# Patient Record
Sex: Female | Born: 1990 | Race: Black or African American | Hispanic: No | Marital: Single | Smoking: Never smoker
Health system: Southern US, Community
[De-identification: ages and names within clinical notes are randomized; demographics above are authoritative.]

## PROBLEM LIST (undated history)

## (undated) DIAGNOSIS — Z789 Other specified health status: Secondary | ICD-10-CM

## (undated) DIAGNOSIS — F419 Anxiety disorder, unspecified: Secondary | ICD-10-CM

## (undated) HISTORY — PX: NO PAST SURGERIES: SHX2092

---
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
## 2011-01-30 LAB — WET PREP, GENITAL
Trich, Wet Prep: NONE SEEN
WBC, Wet Prep HPF POC: NONE SEEN

## 2011-01-30 LAB — GC/CHLAMYDIA PROBE AMP, GENITAL
Chlamydia, DNA Probe: NEGATIVE
GC Probe Amp, Genital: NEGATIVE

## 2011-01-30 LAB — POCT PREGNANCY, URINE: Preg Test, Ur: NEGATIVE

## 2012-07-04 ENCOUNTER — Encounter (HOSPITAL_BASED_OUTPATIENT_CLINIC_OR_DEPARTMENT_OTHER): Payer: Self-pay | Admitting: *Deleted

## 2012-07-04 ENCOUNTER — Emergency Department (HOSPITAL_BASED_OUTPATIENT_CLINIC_OR_DEPARTMENT_OTHER)
Admission: EM | Admit: 2012-07-04 | Discharge: 2012-07-04 | Disposition: A | Discharge status: Home / Self Care | Payer: No Typology Code available for payment source | Attending: Emergency Medicine | Admitting: Emergency Medicine

## 2012-07-04 ENCOUNTER — Emergency Department (HOSPITAL_BASED_OUTPATIENT_CLINIC_OR_DEPARTMENT_OTHER): Payer: No Typology Code available for payment source

## 2012-07-04 DIAGNOSIS — IMO0002 Reserved for concepts with insufficient information to code with codable children: Secondary | ICD-10-CM | POA: Insufficient documentation

## 2012-07-04 DIAGNOSIS — S39012A Strain of muscle, fascia and tendon of lower back, initial encounter: Secondary | ICD-10-CM

## 2012-07-04 DIAGNOSIS — S40019A Contusion of unspecified shoulder, initial encounter: Secondary | ICD-10-CM

## 2012-07-04 DIAGNOSIS — S20219A Contusion of unspecified front wall of thorax, initial encounter: Secondary | ICD-10-CM | POA: Insufficient documentation

## 2012-07-04 MED ORDER — IBUPROFEN 600 MG PO TABS: 600.0000 mg | ORAL_TABLET | Freq: Four times a day (QID) | ORAL | Status: AC | PRN

## 2012-07-04 MED ORDER — OXYCODONE-ACETAMINOPHEN 5-325 MG PO TABS
2.0000 | ORAL_TABLET | Freq: Once | ORAL | Status: DC
  Filled 2012-07-04 (×3): qty 2

## 2012-07-04 MED ORDER — HYDROCODONE-ACETAMINOPHEN 5-500 MG PO TABS: 1.0000 | ORAL_TABLET | Freq: Four times a day (QID) | ORAL | Status: AC | PRN
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
## 2014-10-25 ENCOUNTER — Encounter (HOSPITAL_COMMUNITY): Payer: Self-pay | Admitting: Emergency Medicine

## 2014-10-25 ENCOUNTER — Emergency Department (HOSPITAL_COMMUNITY)
Admission: EM | Admit: 2014-10-25 | Discharge: 2014-10-26 | Disposition: A | Discharge status: Home / Self Care | Payer: No Typology Code available for payment source | Attending: Emergency Medicine | Admitting: Emergency Medicine

## 2014-10-25 DIAGNOSIS — B373 Candidiasis of vulva and vagina: Secondary | ICD-10-CM | POA: Insufficient documentation

## 2014-10-25 DIAGNOSIS — Y9289 Other specified places as the place of occurrence of the external cause: Secondary | ICD-10-CM | POA: Insufficient documentation

## 2014-10-25 DIAGNOSIS — S30814A Abrasion of vagina and vulva, initial encounter: Secondary | ICD-10-CM

## 2014-10-25 DIAGNOSIS — Y288XXA Contact with other sharp object, undetermined intent, initial encounter: Secondary | ICD-10-CM | POA: Insufficient documentation

## 2014-10-25 DIAGNOSIS — B9689 Other specified bacterial agents as the cause of diseases classified elsewhere: Secondary | ICD-10-CM

## 2014-10-25 DIAGNOSIS — N760 Acute vaginitis: Secondary | ICD-10-CM | POA: Insufficient documentation

## 2014-10-25 DIAGNOSIS — B3731 Acute candidiasis of vulva and vagina: Secondary | ICD-10-CM

## 2014-10-25 DIAGNOSIS — Y9389 Activity, other specified: Secondary | ICD-10-CM | POA: Insufficient documentation

## 2014-10-25 DIAGNOSIS — Y998 Other external cause status: Secondary | ICD-10-CM | POA: Insufficient documentation

## 2014-10-25 DIAGNOSIS — N762 Acute vulvitis: Secondary | ICD-10-CM

## 2014-10-25 NOTE — ED Notes (Signed)
Pt. reports vaginal skin irritation after shaving 3 days ago reports pain at vagina  , denies fever or chills. No discharge .

## 2014-10-25 NOTE — ED Provider Notes (Signed)
CSN: [id number]     Arrival date & time 10/25/14  2233 History   First MD Initiated Contact with Patient 10/25/14 2309     Chief Complaint  Patient presents with  . Vaginal Pain     (Consider location/radiation/quality/duration/timing/severity/associated sxs/prior Treatment) HPI Comments: Whitney MonarchMegan Lang is a 23 y.o. Healthy female, who presents to the ED with complaints of vaginal irritation after shaving 3 days ago. She reports that she caused 3 cuts on her labia near the introitus after using a dull razor. She describes the pain as stinging/burning, intermittent, 6/10 in severity, located in the areas that are cut/abraded, nonradiating, worse with moisture or urine exposure to the skin, and unrelieved with vagisil. She has not tried anything else for her symptoms. She reports the left labia has become slightly swollen and mildly erythematous, with no induration, warmth, or red streaking. She states that there is a clear drainage from the cuts but no purulent drainage. Denies fevers, chills, CP, SOB, abdominal pain, nausea, vomiting, diarrhea, constipation, dysuria, hematuria, hematochezia, melena, vaginal discharge, vaginal bleeding, myalgias, arthralgias, rectal pain/bleeding, numbness, weakness, paresthesias, or lymphadenopathy. Sexually active with 1 female partner, no vaginal penetration.   Patient is a 23 y.o. female presenting with general illness. The history is provided by the patient. No language interpreter was used.  Illness Location:  Labia Quality:  Burning/stinging Severity:  Mild Onset quality:  Gradual Duration:  3 days Timing:  Intermittent Progression:  Unchanged Chronicity:  New Context:  Cut labia while shaving Relieved by:  Nothing Worsened by:  Moisture, urine touching cuts Ineffective treatments:  Vagisil Associated symptoms: no abdominal pain, no chest pain, no diarrhea, no fever, no myalgias, no nausea, no rash, no shortness of breath and no vomiting     History  reviewed. No pertinent past medical history. History reviewed. No pertinent past surgical history. No family history on file. History  Substance Use Topics  . Smoking status: Never Smoker   . Smokeless tobacco: Not on file  . Alcohol Use: No   OB History    No data available     Review of Systems  Constitutional: Negative for fever and chills.  Respiratory: Negative for shortness of breath.   Cardiovascular: Negative for chest pain.  Gastrointestinal: Negative for nausea, vomiting, abdominal pain, diarrhea, constipation, blood in stool and rectal pain.  Genitourinary: Positive for genital sores (cuts from shaving) and vaginal pain (labial). Negative for dysuria, urgency, frequency, hematuria, flank pain, vaginal bleeding, vaginal discharge, difficulty urinating and pelvic pain.  Musculoskeletal: Negative for myalgias, back pain and arthralgias.  Skin: Negative for rash.  Neurological: Negative for weakness and numbness.  Hematological: Negative for adenopathy.   10 Systems reviewed and are negative for acute change except as noted in the HPI.    Allergies  Review of patient's allergies indicates no known allergies.  Home Medications   Prior to Admission medications   Not on File   BP 113/74 mmHg  Pulse 99  Temp(Src) 98.6 F (37 C) (Oral)  Resp 20  Ht 5\' 1"  (1.549 m)  Wt 130 lb (58.968 kg)  BMI 24.58 kg/m2  SpO2 100%  LMP 10/08/2014 Physical Exam  Constitutional: She is oriented to person, place, and time. Vital signs are normal. She appears well-developed and well-nourished.  Non-toxic appearance. No distress.  Afebrile, nontoxic, NAD  HENT:  Head: Normocephalic and atraumatic.  Mouth/Throat: Oropharynx is clear and moist and mucous membranes are normal.  Eyes: Conjunctivae and EOM are normal. Right eye exhibits no  discharge. Left eye exhibits no discharge.  Neck: Normal range of motion. Neck supple.  Cardiovascular: Normal rate, regular rhythm, normal heart  sounds and intact distal pulses.  Exam reveals no gallop and no friction rub.   No murmur heard. Pulmonary/Chest: Effort normal and breath sounds normal. No respiratory distress. She has no decreased breath sounds. She has no wheezes. She has no rhonchi. She has no rales.  Abdominal: Soft. Normal appearance and bowel sounds are normal. She exhibits no distension. There is no tenderness. There is no rigidity, no rebound, no guarding, no CVA tenderness, no tenderness at McBurney's point and negative Murphy's sign.  Soft, NTND, +BS throughout, no r/g/r, neg murphy's, neg mcburney's, no CVA TTP  Genitourinary:    Pelvic exam was performed with patient supine. There is tenderness and injury on the right labia. There is no rash or lesion on the right labia. There is tenderness and injury on the left labia. There is no rash or lesion on the left labia. No bleeding in the vagina. Vaginal discharge found.  3 small abrasions located on b/l labial near introitus, exquisitely TTP, no vesicles or ulcerations, with L labia erythematous and slightly swollen without warmth, induration, red streaking, or abscess. No drainage from wounds but small amount of whiteish thin vaginal discharge at introitus. No bartholin's cyst or abscess palpated. No LAD. No vaginal bleeding. Did not perform full pelvic exam.  Musculoskeletal: Normal range of motion.  Lymphadenopathy:       Right: No inguinal adenopathy present.       Left: No inguinal adenopathy present.  Neurological: She is alert and oriented to person, place, and time. She has normal strength. No sensory deficit.  Skin: Skin is warm, dry and intact. No rash noted.  Psychiatric: She has a normal mood and affect.  Nursing note and vitals reviewed.   ED Course  Procedures (including critical care time) Labs Review Labs Reviewed  WET PREP, GENITAL - Abnormal; Notable for the following:    Yeast Wet Prep HPF POC FEW (*)    Clue Cells Wet Prep HPF POC MANY (*)     WBC, Wet Prep HPF POC MODERATE (*)    All other components within normal limits    Imaging Review No results found.   EKG Interpretation None      MDM   Final diagnoses:  Yeast vaginitis  BV (bacterial vaginosis)  Labial abrasion, initial encounter  Cellulitis of labia    23 y.o. female here with vaginal abrasions after shaving which appear to be progressing into cellulitis. Visual exam reveals small abrasions, no vesicles or ulcerations, slight labial swelling which appears to be early cellulitis, no abscess or induration. Doubt syphilis or herpes, no bartholin's gland cyst/abscess, doubt lymphogranuloma venereum. Blind swab of vaginal cavity performed to eval for BV/yeast given small amount of vaginal discharge at introitus. No abd pain or dysuria/hematuria. Pt not sexually active with males, no complaints of vaginal discharge, Doubt need for further labs or testing or a full pelvic exam. Will reassess shortly.   12:27 AM Wet prep showing yeast and BV. Will treat for these. Will also give doxy for what appears to be a cellulitis related to shaving. Discussed proper pericare.  Will have her f/up with OBGYN for ongoing care. I explained the diagnosis and have given explicit precautions to return to the ER including for any other new or worsening symptoms. The patient understands and accepts the medical plan as it's been dictated and I have answered  their questions. Discharge instructions concerning home care and prescriptions have been given. The patient is STABLE and is discharged to home in good condition.  BP 107/71 mmHg  Pulse 81  Temp(Src) 98.6 F (37 C) (Oral)  Resp 22  Ht 5\' 1"  (1.549 m)  Wt 130 lb (58.968 kg)  BMI 24.58 kg/m2  SpO2 100%  LMP 10/08/2014  Meds ordered this encounter  Medications  . metroNIDAZOLE (FLAGYL) 500 MG tablet    Sig: Take 1 tablet (500 mg total) by mouth 2 (two) times daily. One po bid x 7 days    Dispense:  14 tablet    Refill:  0    Order  Specific Question:  Supervising Provider    Answer:  Eber HongMILLER, BRIAN D [[id number]]  . fluconazole (DIFLUCAN) 150 MG tablet    Sig: Take 1 tablet (150 mg total) by mouth once.    Dispense:  1 tablet    Refill:  0    Order Specific Question:  Supervising Provider    Answer:  Eber HongMILLER, BRIAN D [[id number]]  . doxycycline (VIBRAMYCIN) 100 MG capsule    Sig: Take 1 capsule (100 mg total) by mouth 2 (two) times daily. One po bid x 7 days    Dispense:  14 capsule    Refill:  0    Order Specific Question:  Supervising Provider    Answer:  Vida RollerMILLER, BRIAN D [id number][[id number]]     Pam Vanalstine Strupp Camprubi-Soms, PA-C 10/26/14 0111  Geoffery Lyonsouglas Delo, MD 10/26/14 [phone]

## 2014-10-25 NOTE — ED Notes (Signed)
Pt monitored by pulse ox, bp cuff, and 5-lead. 

## 2014-10-26 LAB — WET PREP, GENITAL: Trich, Wet Prep: NONE SEEN

## 2014-10-26 MED ORDER — DOXYCYCLINE HYCLATE 100 MG PO CAPS: 100.0000 mg | ORAL_CAPSULE | Freq: Two times a day (BID) | ORAL | Status: DC

## 2014-10-26 MED ORDER — METRONIDAZOLE 500 MG PO TABS: 500.0000 mg | ORAL_TABLET | Freq: Two times a day (BID) | ORAL | Status: DC

## 2014-10-26 MED ORDER — FLUCONAZOLE 150 MG PO TABS: 150.0000 mg | ORAL_TABLET | Freq: Once | ORAL | Status: DC

## 2014-10-26 NOTE — Discharge Instructions (Signed)
Stay very well hydrated with plenty of water throughout the day. Take antibiotics until completed, avoid alcohol and direct sunlight. Use tylenol or motrin for pain. Keep your skin clean and dry, perform sitz baths 4 times per day, and change your underwear whenever it is wet. Keep your vagina dry using a cold hairdryer. Avoid shaving. See the women's clinic in 1 week for recheck of ongoing symptoms but return to ER for emergent changing or worsening of symptoms.   Bacterial Vaginosis Bacterial vaginosis is an infection of the vagina. It happens when too many of certain germs (bacteria) grow in the vagina. HOME CARE  Take your medicine as told by your doctor.  Finish your medicine even if you start to feel better.  Do not have sex until you finish your medicine and are better.  Tell your sex partner that you have an infection. They should see their doctor for treatment.  Practice safe sex. Use condoms. Have only one sex partner. GET HELP IF:  You are not getting better after 3 days of treatment.  You have more grey fluid (discharge) coming from your vagina than before.  You have more pain than before.  You have a fever. MAKE SURE YOU:   Understand these instructions.  Will watch your condition.  Will get help right away if you are not doing well or get worse. Document Released: 08/08/2008 Document Revised: 08/20/2013 Document Reviewed: 06/11/2013 [facility] Patient Information 2015 [place], [place]. This information is not intended to replace advice given to you by your health care provider. Make sure you discuss any questions you have with your health care provider.  Candidal Vulvovaginitis Candidal vulvovaginitis is an infection of the vagina and vulva. The vulva is the skin around the opening of the vagina. This may cause itching and discomfort in and around the vagina.  HOME CARE  Only take medicine as told by your doctor.  Do not have sex (intercourse) until the infection  is healed or as told by your doctor.  Practice safe sex.  Tell your sex partner about your infection.  Do not douche or use tampons.  Wear cotton underwear. Do not wear tight pants or panty hose.  Eat yogurt. This may help treat and prevent yeast infections. GET HELP RIGHT AWAY IF:   You have a fever.  Your problems get worse during treatment or do not get better in 3 days.  You have discomfort, irritation, or itching in your vagina or vulva area.  You have pain after sex.  You start to get belly (abdominal) pain. MAKE SURE YOU:  Understand these instructions.  Will watch your condition.  Will get help right away if you are not doing well or get worse. Document Released: 01/26/2009 Document Revised: 11/04/2013 Document Reviewed: 01/26/2009 [facility]Exit[facility] Patient Information 2015 [place]Care, [place]. This information is not intended to replace advice given to you by your health care provider. Make sure you discuss any questions you have with your health care provider.   Cellulitis Cellulitis is an infection of the skin and the tissue under the skin. The infected area is usually red and tender. This happens most often in the arms and lower legs. HOME CARE   Take your antibiotic medicine as told. Finish the medicine even if you start to feel better.  Keep the infected arm or leg raised (elevated).  Put a warm cloth on the area up to 4 times per day.  Only take medicines as told by your doctor.  Keep all doctor  visits as told. GET HELP IF:  You see red streaks on the skin coming from the infected area.  Your red area gets bigger or turns a dark color.  Your bone or joint under the infected area is painful after the skin heals.  Your infection comes back in the same area or different area.  You have a puffy (swollen) bump in the infected area.  You have new symptoms.  You have a fever. GET HELP RIGHT AWAY IF:   You feel very sleepy.  You throw up (vomit) or have  watery poop (diarrhea).  You feel sick and have muscle aches and pains. MAKE SURE YOU:   Understand these instructions.  Will watch your condition.  Will get help right away if you are not doing well or get worse. Document Released: 04/17/2008 Document Revised: 03/16/2014 Document Reviewed: 01/15/2012 [facility] Patient Information 2015 [place], [place]LL[place]. This information is not intended to replace advice given to you by your health care provider. Make sure you discuss any questions you have with your health care provider.  Abrasions An abrasion is a cut or scrape of the skin. Abrasions do not go through all layers of the skin. HOME CARE  If a bandage (dressing) was put on your wound, change it as told by your doctor. If the bandage sticks, soak it off with warm.  Wash the area with water and soap 2 times a day. Rinse off the soap. Pat the area dry with a clean towel.  Put on medicated cream (ointment) as told by your doctor.  Change your bandage right away if it gets wet or dirty.  Only take medicine as told by your doctor.  See your doctor within 24-48 hours to get your wound checked.  Check your wound for redness, puffiness (swelling), or yellowish-white fluid (pus). GET HELP RIGHT AWAY IF:   You have more pain in the wound.  You have redness, swelling, or tenderness around the wound.  You have pus coming from the wound.  You have a fever or lasting symptoms for more than 2-3 days.  You have a fever and your symptoms suddenly get worse.  You have a bad smell coming from the wound or bandage. MAKE SURE YOU:   Understand these instructions.  Will watch your condition.  Will get help right away if you are not doing well or get worse. Document Released: 04/17/2008 Document Revised: 07/24/2012 Document Reviewed: 10/03/2011 [facility] StocktonExitCare Patient Information 2015 [place]ExitCare, [place]LL[place]. This information is not intended to replace advice given to you by your health care provider. Make  sure you discuss any questions you have with your health care provider.

## 2014-12-15 ENCOUNTER — Emergency Department (HOSPITAL_COMMUNITY)
Admission: EM | Admit: 2014-12-15 | Discharge: 2014-12-15 | Disposition: A | Discharge status: Home / Self Care | Payer: Self-pay | Attending: Emergency Medicine | Admitting: Emergency Medicine

## 2014-12-15 ENCOUNTER — Encounter (HOSPITAL_COMMUNITY): Payer: Self-pay | Admitting: Emergency Medicine

## 2014-12-15 ENCOUNTER — Emergency Department (HOSPITAL_COMMUNITY): Payer: Self-pay

## 2014-12-15 DIAGNOSIS — S6991XA Unspecified injury of right wrist, hand and finger(s), initial encounter: Secondary | ICD-10-CM | POA: Insufficient documentation

## 2014-12-15 DIAGNOSIS — Y92410 Unspecified street and highway as the place of occurrence of the external cause: Secondary | ICD-10-CM | POA: Insufficient documentation

## 2014-12-15 DIAGNOSIS — Y998 Other external cause status: Secondary | ICD-10-CM | POA: Insufficient documentation

## 2014-12-15 DIAGNOSIS — Z792 Long term (current) use of antibiotics: Secondary | ICD-10-CM | POA: Insufficient documentation

## 2014-12-15 DIAGNOSIS — Y9389 Activity, other specified: Secondary | ICD-10-CM | POA: Insufficient documentation

## 2014-12-15 DIAGNOSIS — Z79899 Other long term (current) drug therapy: Secondary | ICD-10-CM | POA: Insufficient documentation

## 2014-12-15 DIAGNOSIS — M79644 Pain in right finger(s): Secondary | ICD-10-CM

## 2014-12-15 MED ORDER — HYDROCODONE-ACETAMINOPHEN 5-325 MG PO TABS: 1.0000 | ORAL_TABLET | Freq: Four times a day (QID) | ORAL | Status: DC | PRN
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
## 2014-12-15 NOTE — Progress Notes (Signed)
Orthopedic Tech Progress Note Patient Details:  Whitney Lang 02/10/1991 [id number]  Ortho Devices Type of Ortho Device: Thumb velcro splint Ortho Device/Splint Location: RUE Ortho Device/Splint Interventions: Ordered, Application   Jennye MoccasinHughes, Petrina Melby Craig 12/15/2014, 6:32 PM
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
## 2014-12-15 NOTE — Discharge Instructions (Signed)
Scaphoid Fracture, Wrist A fracture is a break in the bone. The bone you have broken often does not show up as a fracture on x-ray until later on in the healing phase. This bone is called the scaphoid bone. With this bone, your caregiver will often cast or splint your wrist as though it is fractured, even if a fracture is not seen on the x-ray. This is often done with wrist injuries in which there is tenderness at the base of the thumb. An x-ray at 1-3 weeks after your injury may confirm this fracture. A cast or splint is used to protect and keep your injured bone in good position for healing. The cast or splint will be on generally for about 6 to 16 weeks, depending on your health, age, the fracture location and how quickly you heal. Another name for the scaphoid bone is the navicular bone. HOME CARE INSTRUCTIONS   To lessen the swelling and pain, keep the injured part elevated above your heart while sitting or lying down.  Apply ice to the injury for 15-20 minutes, 03-04 times per day while awake, for 2 days. Put the ice in a plastic bag and place a thin towel between the bag of ice and your cast.  If you have a plaster or fiberglass cast or splint:  Do not try to scratch the skin under the cast using sharp or pointed objects.  Check the skin around the cast every day. You may put lotion on any red or sore areas.  Keep your cast or splint dry and clean.  If you have a plaster splint:  Wear the splint as directed.  You may loosen the elastic bandage around the splint if your fingers become numb, tingle, or turn cold or blue.  If you have been put in a removable splint, wear and use as directed.  Do not put pressure on any part of your cast or splint; it may deform or break. Rest your cast or splint only on a pillow the first 24 hours until it is fully hardened.  Your cast or splint can be protected during bathing with a plastic bag. Do not lower the cast or splint into water.  Only take  over-the-counter or prescription medicines for pain, discomfort, or fever as directed by your caregiver.  If your caregiver has given you a follow up appointment, it is very important to keep that appointment. Not keeping the appointment could result in chronic pain and decreased function. If there is any problem keeping the appointment, you must call back to this facility for assistance. SEEK IMMEDIATE MEDICAL CARE IF:   Your cast gets damaged, wet or breaks.  You have continued severe pain or more swelling than you did before the cast or splint was put on.  Your skin or nails below the injury turn blue or gray, or feel cold or numb.  You have tingling or burning pain in your fingers or increasing pain with movement of your fingers Document Released: 10/20/2002 Document Revised: 01/22/2012 Document Reviewed: 06/18/2009 ExitCare Patient Information 2015 [employer]. This information is not intended to replace advice given to you by your health care provider. Make sure you discuss any questions you have with your health care provider.  

## 2015-01-12 ENCOUNTER — Emergency Department (HOSPITAL_COMMUNITY)
Admission: EM | Admit: 2015-01-12 | Discharge: 2015-01-12 | Discharge status: Against Medical Advice | Payer: Self-pay | Attending: Emergency Medicine | Admitting: Emergency Medicine

## 2015-01-12 ENCOUNTER — Encounter (HOSPITAL_COMMUNITY): Payer: Self-pay | Admitting: Emergency Medicine

## 2015-01-12 DIAGNOSIS — Z202 Contact with and (suspected) exposure to infections with a predominantly sexual mode of transmission: Secondary | ICD-10-CM | POA: Insufficient documentation
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
## 2015-02-05 ENCOUNTER — Encounter (HOSPITAL_COMMUNITY): Payer: Self-pay | Admitting: Emergency Medicine

## 2015-02-05 ENCOUNTER — Emergency Department (HOSPITAL_COMMUNITY)
Admission: EM | Admit: 2015-02-05 | Discharge: 2015-02-05 | Disposition: A | Discharge status: Home / Self Care | Payer: Self-pay | Attending: Emergency Medicine | Admitting: Emergency Medicine

## 2015-02-05 DIAGNOSIS — R197 Diarrhea, unspecified: Secondary | ICD-10-CM | POA: Insufficient documentation

## 2015-02-05 DIAGNOSIS — Z3202 Encounter for pregnancy test, result negative: Secondary | ICD-10-CM | POA: Insufficient documentation

## 2015-02-05 DIAGNOSIS — R1110 Vomiting, unspecified: Secondary | ICD-10-CM

## 2015-02-05 DIAGNOSIS — R112 Nausea with vomiting, unspecified: Secondary | ICD-10-CM | POA: Insufficient documentation

## 2015-02-05 DIAGNOSIS — Z792 Long term (current) use of antibiotics: Secondary | ICD-10-CM | POA: Insufficient documentation

## 2015-02-05 LAB — URINALYSIS, ROUTINE W REFLEX MICROSCOPIC
Bilirubin Urine: NEGATIVE
Glucose, UA: NEGATIVE mg/dL
Hgb urine dipstick: NEGATIVE
Ketones, ur: NEGATIVE mg/dL
Leukocytes, UA: NEGATIVE
Nitrite: NEGATIVE
Protein, ur: NEGATIVE mg/dL
Specific Gravity, Urine: 1.022 (ref 1.005–1.030)
Urobilinogen, UA: 0.2 mg/dL (ref 0.0–1.0)
pH: 8 (ref 5.0–8.0)

## 2015-02-05 LAB — COMPREHENSIVE METABOLIC PANEL
ALT: 16 U/L (ref 0–35)
AST: 24 U/L (ref 0–37)
Albumin: 4.6 g/dL (ref 3.5–5.2)
Alkaline Phosphatase: 73 U/L (ref 39–117)
Anion gap: 11 (ref 5–15)
BUN: 12 mg/dL (ref 6–23)
CO2: 25 mmol/L (ref 19–32)
Calcium: 9.6 mg/dL (ref 8.4–10.5)
Chloride: 104 mmol/L (ref 96–112)
Creatinine, Ser: 0.72 mg/dL (ref 0.50–1.10)
GFR calc Af Amer: >90 mL/min (ref >90)
GFR calc non Af Amer: >90 mL/min (ref >90)
Glucose, Bld: 95 mg/dL (ref 70–99)
Potassium: 4.1 mmol/L (ref 3.5–5.1)
Sodium: 140 mmol/L (ref 135–145)
Total Bilirubin: 0.6 mg/dL (ref 0.3–1.2)
Total Protein: 8.8 g/dL — ABNORMAL HIGH (ref 6.0–8.3)

## 2015-02-05 LAB — CBC WITH DIFFERENTIAL/PLATELET
Basophils Absolute: 0 10*3/uL (ref 0.0–0.1)
Basophils Relative: 0 % (ref 0–1)
Eosinophils Absolute: 0 10*3/uL (ref 0.0–0.7)
Eosinophils Relative: 0 % (ref 0–5)
HCT: 40 % (ref 36.0–46.0)
Hemoglobin: 12.3 g/dL (ref 12.0–15.0)
Lymphocytes Relative: 8 % — ABNORMAL LOW (ref 12–46)
Lymphs Abs: 0.7 10*3/uL (ref 0.7–4.0)
MCH: 25.9 pg — ABNORMAL LOW (ref 26.0–34.0)
MCHC: 30.8 g/dL (ref 30.0–36.0)
MCV: 84.2 fL (ref 78.0–100.0)
Monocytes Absolute: 0.5 10*3/uL (ref 0.1–1.0)
Monocytes Relative: 5 % (ref 3–12)
Neutro Abs: 7.7 10*3/uL (ref 1.7–7.7)
Neutrophils Relative %: 87 % — ABNORMAL HIGH (ref 43–77)
Platelets: 440 10*3/uL — ABNORMAL HIGH (ref 150–400)
RBC: 4.75 MIL/uL (ref 3.87–5.11)
RDW: 13.8 % (ref 11.5–15.5)
WBC: 8.9 10*3/uL (ref 4.0–10.5)

## 2015-02-05 LAB — LIPASE, BLOOD: Lipase: 16 U/L (ref 11–59)

## 2015-02-05 LAB — PREGNANCY, URINE: Preg Test, Ur: NEGATIVE

## 2015-02-05 MED ORDER — ONDANSETRON HCL 4 MG/2ML IJ SOLN
4.0000 mg | Freq: Once | INTRAMUSCULAR | Status: AC
  Administered 2015-02-05: 4 mg via INTRAVENOUS
  Filled 2015-02-05: qty 2

## 2015-02-05 MED ORDER — KETOROLAC TROMETHAMINE 30 MG/ML IJ SOLN
30.0000 mg | Freq: Once | INTRAMUSCULAR | Status: AC
Start: 2015-02-05 — End: 2015-02-05
  Administered 2015-02-05: 30 mg via INTRAVENOUS
  Filled 2015-02-05: qty 1

## 2015-02-05 MED ORDER — IBUPROFEN 200 MG PO TABS
600.0000 mg | ORAL_TABLET | Freq: Once | ORAL | Status: AC
  Administered 2015-02-05: 600 mg via ORAL
  Filled 2015-02-05: qty 3

## 2015-02-05 MED ORDER — ONDANSETRON 4 MG PO TBDP
4.0000 mg | ORAL_TABLET | Freq: Three times a day (TID) | ORAL | Status: DC | PRN
Start: 2015-02-05 — End: 2015-10-13

## 2015-02-05 MED ORDER — SODIUM CHLORIDE 0.9 % IV BOLUS (SEPSIS)
1000.0000 mL | Freq: Once | INTRAVENOUS | Status: AC
  Administered 2015-02-05: 1000 mL via INTRAVENOUS

## 2015-02-05 NOTE — ED Provider Notes (Signed)
CSN: [id number]     Arrival date & time 02/05/15  1554 History   First MD Initiated Contact with Patient 02/05/15 1803     Chief Complaint  Patient presents with  . Emesis  . Generalized Body Aches     (Consider location/radiation/quality/duration/timing/severity/associated sxs/prior Treatment) Patient is a 24 y.o. female presenting with vomiting. The history is provided by the patient. No language interpreter was used.  Emesis Severity:  Moderate Duration:  1 day Timing:  Constant Number of daily episodes:  Multiple Progression:  Worsening Chronicity:  New Recent urination:  Normal Relieved by:  Nothing Ineffective treatments:  None tried Associated symptoms: diarrhea   Associated symptoms: no abdominal pain   Risk factors: sick contacts   Risk factors: no diabetes     History reviewed. No pertinent past medical history. History reviewed. No pertinent past surgical history. History reviewed. No pertinent family history. History  Substance Use Topics  . Smoking status: Never Smoker   . Smokeless tobacco: Never Used  . Alcohol Use: No   OB History    No data available     Review of Systems  Gastrointestinal: Positive for nausea, vomiting and diarrhea. Negative for abdominal pain.  All other systems reviewed and are negative.     Allergies  Review of patient's allergies indicates no known allergies.  Home Medications   Prior to Admission medications   Medication Sig Start Date End Date Taking? Authorizing Provider  doxycycline (VIBRAMYCIN) 100 MG capsule Take 1 capsule (100 mg total) by mouth 2 (two) times daily. One po bid x 7 days Patient not taking: Reported on 01/13/2015 10/26/14   Mercedes Camprubi-Soms, PA-C  fluconazole (DIFLUCAN) 150 MG tablet Take 1 tablet (150 mg total) by mouth once. Patient not taking: Reported on 01/13/2015 10/26/14   Mercedes Camprubi-Soms, PA-C  HYDROcodone-acetaminophen (NORCO/VICODIN) 5-325 MG per tablet Take 1-2 tablets by mouth  every 6 (six) hours as needed. Patient not taking: Reported on 01/13/2015 12/15/14   Roxy Horseman, PA-C  metroNIDAZOLE (FLAGYL) 500 MG tablet Take 1 tablet (500 mg total) by mouth 2 (two) times daily. One po bid x 7 days Patient not taking: Reported on 01/13/2015 10/26/14   Mercedes Camprubi-Soms, PA-C   BP 103/67 mmHg  Pulse 109  Temp(Src) 100.7 F (38.2 C) (Oral)  Resp 18  SpO2 100%  LMP 01/28/2015 Physical Exam  Constitutional: She is oriented to person, place, and time. She appears well-developed and well-nourished.  HENT:  Head: Normocephalic.  Eyes: Conjunctivae and EOM are normal. Pupils are equal, round, and reactive to light.  Neck: Normal range of motion.  Cardiovascular: Normal rate and normal heart sounds.   Pulmonary/Chest: Effort normal.  Abdominal: Soft. She exhibits no distension.  Musculoskeletal: Normal range of motion.  Neurological: She is alert and oriented to person, place, and time. She has normal reflexes.  Skin: Skin is warm.  Psychiatric: She has a normal mood and affect.  Nursing note and vitals reviewed.   ED Course  Procedures (including critical care time) Labs Review Labs Reviewed  CBC WITH DIFFERENTIAL/PLATELET - Abnormal; Notable for the following:    MCH 25.9 (*)    Platelets 440 (*)    Neutrophils Relative % 87 (*)    Lymphocytes Relative 8 (*)    All other components within normal limits  COMPREHENSIVE METABOLIC PANEL - Abnormal; Notable for the following:    Total Protein 8.8 (*)    All other components within normal limits  LIPASE, BLOOD  URINALYSIS, ROUTINE W  REFLEX MICROSCOPIC  PREGNANCY, URINE    Imaging Review No results found.   EKG Interpretation None      MDM  Pt given iv fluids, zofran and ibuprofen  Pt given rx for zofran odt   Final diagnoses:  Vomiting and diarrhea        Elson AreasLeslie K Marcellina Jonsson, PA-C 02/05/15 [address]40  Alanna Storti K MorrisvilleSofia, PA-C 02/05/15 2326  Mirian MoMatthew Gentry, MD 02/06/15 [phone]

## 2015-02-05 NOTE — Discharge Instructions (Signed)

## 2015-02-05 NOTE — ED Notes (Signed)
Given pain meds given a few mins prior to d/c

## 2015-02-05 NOTE — ED Notes (Signed)
Patient here with complaints of n/v/d, body aches, back pain. Denies pregnancy.
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
## 2015-02-05 NOTE — Progress Notes (Signed)
EDCM spoke to patient at bedside. Patient confirms she does not have a pcp or insurance living in [place].  EDCM provided patient with pamphlet to [facility], informed patient of services there and walk in times.  EDCM also provided patient with list of pcps who accept self pay patients, list of discount pharmacies and websites needymeds.org and GoodRX.com for medication assistance, phone number to inquire about the orange card, phone number to inquire about [employer], phone number to inquire about the Affordable Care Act, financial resources in the community such as local churches, [employer], [facility] minist[facility], and dental assistance for uninsured patients.  Patient thankful for resources.  No further EDCM needs at this time. 

## 2015-07-12 ENCOUNTER — Emergency Department (HOSPITAL_COMMUNITY)
Admission: EM | Admit: 2015-07-12 | Discharge: 2015-07-12 | Disposition: A | Discharge status: Home / Self Care | Payer: Self-pay | Attending: Emergency Medicine | Admitting: Emergency Medicine

## 2015-07-12 ENCOUNTER — Encounter (HOSPITAL_COMMUNITY): Payer: Self-pay | Admitting: Emergency Medicine

## 2015-07-12 DIAGNOSIS — M549 Dorsalgia, unspecified: Secondary | ICD-10-CM

## 2015-07-12 DIAGNOSIS — Y92481 Parking lot as the place of occurrence of the external cause: Secondary | ICD-10-CM | POA: Insufficient documentation

## 2015-07-12 DIAGNOSIS — S3992XA Unspecified injury of lower back, initial encounter: Secondary | ICD-10-CM | POA: Insufficient documentation

## 2015-07-12 DIAGNOSIS — M545 Low back pain, unspecified: Secondary | ICD-10-CM

## 2015-07-12 DIAGNOSIS — Y9389 Activity, other specified: Secondary | ICD-10-CM | POA: Insufficient documentation

## 2015-07-12 DIAGNOSIS — Y998 Other external cause status: Secondary | ICD-10-CM | POA: Insufficient documentation

## 2015-07-12 DIAGNOSIS — S4991XA Unspecified injury of right shoulder and upper arm, initial encounter: Secondary | ICD-10-CM | POA: Insufficient documentation

## 2015-07-12 MED ORDER — METHOCARBAMOL 500 MG PO TABS: 500.0000 mg | ORAL_TABLET | Freq: Two times a day (BID) | ORAL | Status: DC

## 2015-07-12 MED ORDER — NAPROXEN 500 MG PO TABS: 500.0000 mg | ORAL_TABLET | Freq: Two times a day (BID) | ORAL | Status: DC
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
## 2015-07-20 ENCOUNTER — Encounter (HOSPITAL_BASED_OUTPATIENT_CLINIC_OR_DEPARTMENT_OTHER): Payer: Self-pay | Admitting: Emergency Medicine

## 2015-07-20 ENCOUNTER — Emergency Department (HOSPITAL_BASED_OUTPATIENT_CLINIC_OR_DEPARTMENT_OTHER)
Admission: EM | Admit: 2015-07-20 | Discharge: 2015-07-20 | Disposition: A | Discharge status: Home / Self Care | Payer: Self-pay | Attending: Emergency Medicine | Admitting: Emergency Medicine

## 2015-07-20 DIAGNOSIS — M25511 Pain in right shoulder: Secondary | ICD-10-CM | POA: Insufficient documentation

## 2015-07-20 DIAGNOSIS — Z791 Long term (current) use of non-steroidal anti-inflammatories (NSAID): Secondary | ICD-10-CM | POA: Insufficient documentation

## 2015-07-20 DIAGNOSIS — S29012D Strain of muscle and tendon of back wall of thorax, subsequent encounter: Secondary | ICD-10-CM | POA: Insufficient documentation

## 2015-07-20 DIAGNOSIS — Z79899 Other long term (current) drug therapy: Secondary | ICD-10-CM | POA: Insufficient documentation
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
## 2015-07-20 NOTE — ED Notes (Signed)
PA at bedside.
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
## 2015-07-20 NOTE — Discharge Instructions (Signed)
Continue taking the medications prescribed at your last visit. Apply ice and heat intermittently.  Muscle Strain A muscle strain is an injury that occurs when a muscle is stretched beyond its normal length. Usually a small number of muscle fibers are torn when this happens. Muscle strain is rated in degrees. First-degree strains have the least amount of muscle fiber tearing and pain. Second-degree and third-degree strains have increasingly more tearing and pain.  Usually, recovery from muscle strain takes 1-2 weeks. Complete healing takes 5-6 weeks.  CAUSES  Muscle strain happens when a sudden, violent force placed on a muscle stretches it too far. This may occur with lifting, sports, or a fall.  RISK FACTORS Muscle strain is especially common in athletes.  SIGNS AND SYMPTOMS At the site of the muscle strain, there may be:  Pain.  Bruising.  Swelling.  Difficulty using the muscle due to pain or lack of normal function. DIAGNOSIS  Your health care provider will perform a physical exam and ask about your medical history. TREATMENT  Often, the best treatment for a muscle strain is resting, icing, and applying cold compresses to the injured area.  HOME CARE INSTRUCTIONS   Use the PRICE method of treatment to promote muscle healing during the first 2-3 days after your injury. The PRICE method involves:  Protecting the muscle from being injured again.  Restricting your activity and resting the injured body part.  Icing your injury. To do this, put ice in a plastic bag. Place a towel between your skin and the bag. Then, apply the ice and leave it on from 15-20 minutes each hour. After the third day, switch to moist heat packs.  Apply compression to the injured area with a splint or elastic bandage. Be careful not to wrap it too tightly. This may interfere with blood circulation or increase swelling.  Elevate the injured body part above the level of your heart as often as you can.  Only  take over-the-counter or prescription medicines for pain, discomfort, or fever as directed by your health care provider.  Warming up prior to exercise helps to prevent future muscle strains. SEEK MEDICAL CARE IF:   You have increasing pain or swelling in the injured area.  You have numbness, tingling, or a significant loss of strength in the injured area. MAKE SURE YOU:   Understand these instructions.  Will watch your condition.  Will get help right away if you are not doing well or get worse. Document Released: 10/30/2005 Document Revised: 08/20/2013 Document Reviewed: 05/29/2013 [facility] Patient Information 2015 [place], [place]. This information is not intended to replace advice given to you by your health care provider. Make sure you discuss any questions you have with your health care provider.

## 2015-10-12 ENCOUNTER — Emergency Department (HOSPITAL_COMMUNITY): Payer: Self-pay

## 2015-10-12 ENCOUNTER — Encounter (HOSPITAL_COMMUNITY): Payer: Self-pay | Admitting: Emergency Medicine

## 2015-10-12 ENCOUNTER — Emergency Department (HOSPITAL_COMMUNITY)
Admission: EM | Admit: 2015-10-12 | Discharge: 2015-10-13 | Disposition: A | Discharge status: Home / Self Care | Payer: Self-pay | Attending: Emergency Medicine | Admitting: Emergency Medicine

## 2015-10-12 DIAGNOSIS — J029 Acute pharyngitis, unspecified: Secondary | ICD-10-CM | POA: Insufficient documentation

## 2015-10-12 DIAGNOSIS — R6883 Chills (without fever): Secondary | ICD-10-CM | POA: Insufficient documentation

## 2015-10-12 DIAGNOSIS — R000 Tachycardia, unspecified: Secondary | ICD-10-CM | POA: Insufficient documentation

## 2015-10-12 DIAGNOSIS — Z79899 Other long term (current) drug therapy: Secondary | ICD-10-CM | POA: Insufficient documentation

## 2015-10-12 DIAGNOSIS — Z3202 Encounter for pregnancy test, result negative: Secondary | ICD-10-CM | POA: Insufficient documentation

## 2015-10-12 DIAGNOSIS — Z791 Long term (current) use of non-steroidal anti-inflammatories (NSAID): Secondary | ICD-10-CM | POA: Insufficient documentation

## 2015-10-12 DIAGNOSIS — R6889 Other general symptoms and signs: Secondary | ICD-10-CM

## 2015-10-12 DIAGNOSIS — R112 Nausea with vomiting, unspecified: Secondary | ICD-10-CM | POA: Insufficient documentation

## 2015-10-12 LAB — POC URINE PREG, ED: Preg Test, Ur: NEGATIVE

## 2015-10-12 LAB — CBC WITH DIFFERENTIAL/PLATELET
Basophils Absolute: 0 10*3/uL (ref 0.0–0.1)
Basophils Relative: 0 %
Eosinophils Absolute: 0 10*3/uL (ref 0.0–0.7)
Eosinophils Relative: 0 %
HCT: 38.8 % (ref 36.0–46.0)
Hemoglobin: 12.2 g/dL (ref 12.0–15.0)
Lymphocytes Relative: 14 %
Lymphs Abs: 1.7 10*3/uL (ref 0.7–4.0)
MCH: 25.7 pg — ABNORMAL LOW (ref 26.0–34.0)
MCHC: 31.4 g/dL (ref 30.0–36.0)
MCV: 81.7 fL (ref 78.0–100.0)
Monocytes Absolute: 0.7 10*3/uL (ref 0.1–1.0)
Monocytes Relative: 6 %
Neutro Abs: 10.3 10*3/uL — ABNORMAL HIGH (ref 1.7–7.7)
Neutrophils Relative %: 80 %
Platelets: 464 10*3/uL — ABNORMAL HIGH (ref 150–400)
RBC: 4.75 MIL/uL (ref 3.87–5.11)
RDW: 14.7 % (ref 11.5–15.5)
WBC: 12.7 10*3/uL — ABNORMAL HIGH (ref 4.0–10.5)

## 2015-10-12 LAB — URINALYSIS, ROUTINE W REFLEX MICROSCOPIC
Glucose, UA: NEGATIVE mg/dL
Hgb urine dipstick: NEGATIVE
Ketones, ur: >80 mg/dL — AB
Leukocytes, UA: NEGATIVE
Nitrite: NEGATIVE
Protein, ur: NEGATIVE mg/dL
Specific Gravity, Urine: 1.027 (ref 1.005–1.030)
pH: 6.5 (ref 5.0–8.0)

## 2015-10-12 LAB — BASIC METABOLIC PANEL
Anion gap: 10 (ref 5–15)
BUN: 9 mg/dL (ref 6–20)
CO2: 24 mmol/L (ref 22–32)
Calcium: 9.3 mg/dL (ref 8.9–10.3)
Chloride: 103 mmol/L (ref 101–111)
Creatinine, Ser: 0.69 mg/dL (ref 0.44–1.00)
GFR calc Af Amer: >60 mL/min (ref >60)
GFR calc non Af Amer: >60 mL/min (ref >60)
Glucose, Bld: 83 mg/dL (ref 65–99)
Potassium: 3.8 mmol/L (ref 3.5–5.1)
Sodium: 137 mmol/L (ref 135–145)

## 2015-10-12 MED ORDER — KETOROLAC TROMETHAMINE 30 MG/ML IJ SOLN
30.0000 mg | Freq: Once | INTRAMUSCULAR | Status: AC
Start: 2015-10-12 — End: 2015-10-13
  Administered 2015-10-13: 30 mg via INTRAVENOUS
  Filled 2015-10-12: qty 1

## 2015-10-12 MED ORDER — SODIUM CHLORIDE 0.9 % IV BOLUS (SEPSIS)
500.0000 mL | Freq: Once | INTRAVENOUS | Status: AC
  Administered 2015-10-13: 500 mL via INTRAVENOUS

## 2015-10-12 MED ORDER — ACETAMINOPHEN 500 MG PO TABS
1000.0000 mg | ORAL_TABLET | Freq: Once | ORAL | Status: AC
  Administered 2015-10-13: 1000 mg via ORAL
  Filled 2015-10-12: qty 2

## 2015-10-12 MED ORDER — ONDANSETRON HCL 4 MG/2ML IJ SOLN
4.0000 mg | Freq: Once | INTRAMUSCULAR | Status: AC
  Administered 2015-10-13: 4 mg via INTRAVENOUS
  Filled 2015-10-12: qty 2

## 2015-10-12 NOTE — ED Provider Notes (Signed)
CSN: [id number]     Arrival date & time 10/12/15  2032 History   First MD Initiated Contact with Patient 10/12/15 2310     Chief Complaint  Patient presents with  . Emesis  . Cough  . Chills    (Consider location/radiation/quality/duration/timing/severity/associated sxs/prior Treatment) HPI Comments: Patient is a 24 year old female with no significant past medical history who presents to the emergency department for further evaluation of flulike symptoms. She states that her symptoms began 5 days ago as a mild headache. Symptoms worsened 3 days ago as patient developed nasal congestion, rhinorrhea, and postnasal drip. She reports an associated cough which is nonproductive. She has also been having nausea and vomiting with difficulty tolerating food by mouth. Patient has been able to sip small amounts of fluids. She has had chills with no documented fever. Patient has been taking Tylenol and NyQuil and trying to sleep, but she continues to feel worse. She denies any known sick contacts. No abdominal pain or shortness of breath.  Patient is a 24 y.o. female presenting with vomiting and cough. The history is provided by the patient. No language interpreter was used.  Emesis Associated symptoms: chills and sore throat   Associated symptoms: no abdominal pain   Cough Associated symptoms: chills and sore throat   Associated symptoms: no fever and no shortness of breath     History reviewed. No pertinent past medical history. History reviewed. No pertinent past surgical history. No family history on file. Social History  Substance Use Topics  . Smoking status: Never Smoker   . Smokeless tobacco: Never Used  . Alcohol Use: No   OB History    No data available      Review of Systems  Constitutional: Positive for chills. Negative for fever.  HENT: Positive for congestion, postnasal drip and sore throat.   Respiratory: Positive for cough. Negative for shortness of breath.    Gastrointestinal: Positive for nausea and vomiting. Negative for abdominal pain.  Neurological: Negative for syncope.  All other systems reviewed and are negative.   Allergies  Review of patient's allergies indicates no known allergies.  Home Medications   Prior to Admission medications   Medication Sig Start Date End Date Taking? Authorizing Provider  doxycycline (VIBRAMYCIN) 100 MG capsule Take 1 capsule (100 mg total) by mouth 2 (two) times daily. One po bid x 7 days Patient not taking: Reported on 01/13/2015 10/26/14   Mercedes Camprubi-Soms, PA-C  fluconazole (DIFLUCAN) 150 MG tablet Take 1 tablet (150 mg total) by mouth once. Patient not taking: Reported on 01/13/2015 10/26/14   Mercedes Camprubi-Soms, PA-C  HYDROcodone-acetaminophen (NORCO/VICODIN) 5-325 MG per tablet Take 1-2 tablets by mouth every 6 (six) hours as needed. Patient not taking: Reported on 01/13/2015 12/15/14   Roxy Horsemanobert Browning, PA-C  methocarbamol (ROBAXIN) 500 MG tablet Take 1 tablet (500 mg total) by mouth 2 (two) times daily. 07/12/15   Heather Laisure, PA-C  metroNIDAZOLE (FLAGYL) 500 MG tablet Take 1 tablet (500 mg total) by mouth 2 (two) times daily. One po bid x 7 days Patient not taking: Reported on 01/13/2015 10/26/14   Mercedes Camprubi-Soms, PA-C  naproxen (NAPROSYN) 500 MG tablet Take 1 tablet (500 mg total) by mouth 2 (two) times daily. 07/12/15   Heather Laisure, PA-C  ondansetron (ZOFRAN ODT) 4 MG disintegrating tablet Take 1 tablet (4 mg total) by mouth every 8 (eight) hours as needed for nausea or vomiting. 02/05/15   Elson AreasLeslie K Sofia, PA-C   BP 130/72 mmHg  Pulse  100  Temp(Src) 100.5 F (38.1 C) (Oral)  Resp 22  Ht  (1.549 m)  Wt 69.4 kg  BMI 28.92 kg/m2  SpO2 98%  LMP 09/02/2015 (Approximate)   Physical Exam  Constitutional: She is oriented to person, place, and time. She appears well-developed and well-nourished. No distress.  Nontoxic appearing  HENT:  Head: Normocephalic and atraumatic.   Right Ear: Tympanic membrane, external ear and ear canal normal.  Left Ear: Tympanic membrane, external ear and ear canal normal.  Nose: Mucosal edema present.  Mouth/Throat: Uvula is midline, oropharynx is clear and moist and mucous membranes are normal.  Patient tolerating secretions without difficulty. Uvula midline.  Eyes: Conjunctivae and EOM are normal. No scleral icterus.  Neck: Normal range of motion.  No nuchal rigidity or meningismus  Cardiovascular: Regular rhythm and intact distal pulses.   Mild tachycardia  Pulmonary/Chest: Effort normal and breath sounds normal. No respiratory distress. She has no wheezes. She has no rales.  Lungs clear bilaterally. No accessory muscle use. Sporadic dry cough appreciated.  Musculoskeletal: Normal range of motion.  Neurological: She is alert and oriented to person, place, and time. She exhibits normal muscle tone. Coordination normal.  Skin: Skin is warm and dry. No rash noted. She is not diaphoretic. No erythema. No pallor.  Psychiatric: She has a normal mood and affect. Her behavior is normal.  Nursing note and vitals reviewed.   ED Course  Procedures (including critical care time) Labs Review Labs Reviewed  CBC WITH DIFFERENTIAL/PLATELET - Abnormal; Notable for the following:    WBC 12.7 (*)    MCH 25.7 (*)    Platelets 464 (*)    Neutro Abs 10.3 (*)    All other components within normal limits  URINALYSIS, ROUTINE W REFLEX MICROSCOPIC (NOT AT [facility]) - Abnormal; Notable for the following:    Color, Urine AMBER (*)    APPearance CLOUDY (*)    Bilirubin Urine SMALL (*)    Ketones, ur >80 (*)    All other components within normal limits  BASIC METABOLIC PANEL  POC URINE PREG, ED    Imaging Review Dg Chest 2 View  10/13/2015  CLINICAL DATA:  24 year old female with nausea vomiting. Dry cough and low back pain. EXAM: CHEST  2 VIEW COMPARISON:  Radiograph dated 07/02/2013 and chest radiograph dated 07/04/2012 FINDINGS: The heart  size and mediastinal contours are within normal limits. Both lungs are clear. The visualized skeletal structures are unremarkable. IMPRESSION: No active cardiopulmonary disease. Electronically Signed   By: Elgie Collard M.D.   On: 10/13/2015 00:01     I have personally reviewed and evaluated these images and lab results as part of my medical decision-making.   EKG Interpretation None      MDM   Final diagnoses:  Flu-like symptoms    Patient with symptoms consistent with influenza. Vitals are stable, low-grade fever. Lungs are clear and CXR negative for PNA. Patient will be discharged with instructions to orally hydrate, rest, and use over-the-counter medications such as anti-inflammatories for muscle aches and Tylenol for fever.  Patient will also be given a cough suppressant and zofran for nausea. Anticipate d/c after IVF hydration. Return precautions given; VSS.   Filed Vitals:   10/12/15 2038 10/12/15 2323  BP: 155/85 130/72  Pulse: 70 100  Temp: 100.1 F (37.8 C) 100.5 F (38.1 C)  TempSrc: Oral Oral  Resp: 18 22  Height:  (1.549 m)   Weight: 69.4 kg   SpO2: 98% 98%  Antony Madura, PA-C 10/13/15 0025  Mancel Bale, MD 10/13/15 [phone]

## 2015-10-12 NOTE — ED Notes (Signed)
Patient transported to X-ray 

## 2015-10-12 NOTE — ED Notes (Signed)
Pt. presents with multiple complaints : Nausea/emesis onset yesterday , dry cough , low back pain denies fall or injury , nasal congestion , chills and headache .

## 2015-10-13 MED ORDER — IBUPROFEN 600 MG PO TABS: 600.0000 mg | ORAL_TABLET | Freq: Four times a day (QID) | ORAL | Status: DC | PRN

## 2015-10-13 MED ORDER — ONDANSETRON 4 MG PO TBDP
4.0000 mg | ORAL_TABLET | Freq: Three times a day (TID) | ORAL | Status: DC | PRN
Start: 2015-10-13 — End: 2015-11-05

## 2015-10-13 MED ORDER — BENZONATATE 100 MG PO CAPS: 100.0000 mg | ORAL_CAPSULE | Freq: Three times a day (TID) | ORAL | Status: DC | PRN

## 2015-10-13 MED ORDER — FLUTICASONE PROPIONATE 50 MCG/ACT NA SUSP: 2.0000 | Freq: Every day | NASAL | Status: DC

## 2015-10-13 NOTE — Discharge Instructions (Signed)
Take ibuprofen as prescribed for body aches and Tessalon as needed for cough. You may take Tylenol for fever. Use Flonase for nasal congestion and Zofran for nausea/vomiting. Be sure to get plenty of rest and drink plenty of fluids. Follow-up with a primary care doctor for reevaluation of symptoms. Return to the emergency department as needed if symptoms worsen.  Influenza, Adult Influenza ("the flu") is a viral infection of the respiratory tract. It occurs more often in winter months because people spend more time in close contact with one another. Influenza can make you feel very sick. Influenza easily spreads from person to person (contagious). CAUSES  Influenza is caused by a virus that infects the respiratory tract. You can catch the virus by breathing in droplets from an infected person's cough or sneeze. You can also catch the virus by touching something that was recently contaminated with the virus and then touching your mouth, nose, or eyes. RISKS AND COMPLICATIONS You may be at risk for a more severe case of influenza if you smoke cigarettes, have diabetes, have chronic heart disease (such as heart failure) or lung disease (such as asthma), or if you have a weakened immune system. Elderly people and pregnant women are also at risk for more serious infections. The most common problem of influenza is a lung infection (pneumonia). Sometimes, this problem can require emergency medical care and may be life threatening. SIGNS AND SYMPTOMS  Symptoms typically last 4 to 10 days and may include:  Fever.  Chills.  Headache, body aches, and muscle aches.  Sore throat.  Chest discomfort and cough.  Poor appetite.  Weakness or feeling tired.  Dizziness.  Nausea or vomiting. DIAGNOSIS  Diagnosis of influenza is often made based on your history and a physical exam. A nose or throat swab test can be done to confirm the diagnosis. TREATMENT  In mild cases, influenza goes away on its own.  Treatment is directed at relieving symptoms. For more severe cases, your health care provider may prescribe antiviral medicines to shorten the sickness. Antibiotic medicines are not effective because the infection is caused by a virus, not by bacteria. HOME CARE INSTRUCTIONS  Take medicines only as directed by your health care provider.  Use a cool mist humidifier to make breathing easier.  Get plenty of rest until your temperature returns to normal. This usually takes 3 to 4 days.  Drink enough fluid to keep your urine clear or pale yellow.  Cover yourmouth and nosewhen coughing or sneezing,and wash your handswellto prevent thevirusfrom spreading.  Stay homefromwork orschool untilthe fever is gonefor at least 51full day. PREVENTION  An annual influenza vaccination (flu shot) is the best way to avoid getting influenza. An annual flu shot is now routinely recommended for all adults in the [place] SEEK MEDICAL CARE IF:  You experiencechest pain, yourcough worsens,or you producemore mucus.  Youhave nausea,vomiting, ordiarrhea.  Your fever returns or gets worse. SEEK IMMEDIATE MEDICAL CARE IF:  You havetrouble breathing, you become short of breath,or your skin ornails becomebluish.  You have severe painor stiffnessin the neck.  You develop a sudden headache, or pain in the face or ear.  You have nausea or vomiting that you cannot control. MAKE SURE YOU:   Understand these instructions.  Will watch your condition.  Will get help right away if you are not doing well or get worse.   This information is not intended to replace advice given to you by your health care provider. Make sure you discuss any  questions you have with your health care provider.   Document Released: 10/27/2000 Document Revised: 11/20/2014 Document Reviewed: 01/29/2012 Elsevier Interactive Patient Education [employer].

## 2015-10-14 ENCOUNTER — Emergency Department (HOSPITAL_COMMUNITY)
Admission: EM | Admit: 2015-10-14 | Discharge: 2015-10-14 | Disposition: A | Discharge status: Home / Self Care | Payer: Self-pay | Attending: Emergency Medicine | Admitting: Emergency Medicine

## 2015-10-14 ENCOUNTER — Encounter (HOSPITAL_COMMUNITY): Payer: Self-pay

## 2015-10-14 DIAGNOSIS — Z7951 Long term (current) use of inhaled steroids: Secondary | ICD-10-CM | POA: Insufficient documentation

## 2015-10-14 DIAGNOSIS — B9789 Other viral agents as the cause of diseases classified elsewhere: Secondary | ICD-10-CM

## 2015-10-14 DIAGNOSIS — J069 Acute upper respiratory infection, unspecified: Secondary | ICD-10-CM | POA: Insufficient documentation

## 2015-10-14 MED ORDER — OXYMETAZOLINE HCL 0.05 % NA SOLN
2.0000 | Freq: Once | NASAL | Status: AC
  Administered 2015-10-14: 2 via NASAL
  Filled 2015-10-14: qty 15

## 2015-10-14 MED ORDER — DEXAMETHASONE 4 MG PO TABS
10.0000 mg | ORAL_TABLET | Freq: Once | ORAL | Status: AC
  Administered 2015-10-14: 10 mg via ORAL
  Filled 2015-10-14: qty 3

## 2015-10-14 MED ORDER — ACETAMINOPHEN 500 MG PO TABS
1000.0000 mg | ORAL_TABLET | Freq: Once | ORAL | Status: AC
  Administered 2015-10-14: 1000 mg via ORAL
  Filled 2015-10-14: qty 2

## 2015-10-14 MED ORDER — ASPIRIN EC 325 MG PO TBEC
650.0000 mg | DELAYED_RELEASE_TABLET | Freq: Once | ORAL | Status: AC
  Administered 2015-10-14: 650 mg via ORAL
  Filled 2015-10-14: qty 2

## 2015-10-14 NOTE — ED Notes (Signed)
Pt presents with 1 week h/o productive cough, nasal congestion and frontal headache that is now generalized.  Pt seen here for same, given ibuprofen and tessalon perles both which are not working.  Pt reports PIV was placed to L upper arm, pt reports pain and swelling above insertion site.

## 2015-10-14 NOTE — ED Provider Notes (Signed)
CSN: [id number]     Arrival date & time 10/14/15  0744 History   First MD Initiated Contact with Patient 10/14/15 0755     Chief Complaint  Patient presents with  . Headache     (Consider location/radiation/quality/duration/timing/severity/associated sxs/prior Treatment) Patient is a 24 y.o. female presenting with headaches. The history is provided by the patient.  Headache Pain location:  Frontal Quality:  Dull Radiates to:  Does not radiate Onset quality:  Gradual Duration:  1 day Timing:  Constant Progression:  Worsening Chronicity:  New Similar to prior headaches: yes   Context: coughing   Context comment:  Nasal congestion, URI Relieved by:  Nothing Ineffective treatments: Using a steroid nasal inhaler every 30 minutes. Associated symptoms: congestion, cough and fever (resolved)   Associated symptoms: no abdominal pain, no neck stiffness and no vomiting     History reviewed. No pertinent past medical history. History reviewed. No pertinent past surgical history. History reviewed. No pertinent family history. Social History  Substance Use Topics  . Smoking status: Never Smoker   . Smokeless tobacco: Never Used  . Alcohol Use: No   OB History    No data available     Review of Systems  Constitutional: Positive for fever (resolved).  HENT: Positive for congestion.   Respiratory: Positive for cough.   Gastrointestinal: Negative for vomiting and abdominal pain.  Musculoskeletal: Negative for neck stiffness.  Neurological: Positive for headaches.  All other systems reviewed and are negative.     Allergies  Review of patient's allergies indicates no known allergies.  Home Medications   Prior to Admission medications   Medication Sig Start Date End Date Taking? Authorizing Provider  benzonatate (TESSALON) 100 MG capsule Take 1 capsule (100 mg total) by mouth 3 (three) times daily as needed for cough. 10/13/15  Yes Antony Madura, PA-C  fluticasone (FLONASE) 50  MCG/ACT nasal spray Place 2 sprays into both nostrils daily. 10/13/15  Yes Antony Madura, PA-C  ibuprofen (ADVIL,MOTRIN) 600 MG tablet Take 1 tablet (600 mg total) by mouth every 6 (six) hours as needed. 10/13/15  Yes Antony Madura, PA-C  HYDROcodone-acetaminophen (NORCO/VICODIN) 5-325 MG per tablet Take 1-2 tablets by mouth every 6 (six) hours as needed. Patient not taking: Reported on 01/13/2015 12/15/14   Roxy Horseman, PA-C  methocarbamol (ROBAXIN) 500 MG tablet Take 1 tablet (500 mg total) by mouth 2 (two) times daily. Patient not taking: Reported on 10/14/2015 07/12/15   Santiago Glad, PA-C  naproxen (NAPROSYN) 500 MG tablet Take 1 tablet (500 mg total) by mouth 2 (two) times daily. Patient not taking: Reported on 10/14/2015 07/12/15   Santiago Glad, PA-C  ondansetron (ZOFRAN ODT) 4 MG disintegrating tablet Take 1 tablet (4 mg total) by mouth every 8 (eight) hours as needed for nausea or vomiting. Patient not taking: Reported on 10/14/2015 10/13/15   Antony Madura, PA-C   BP 105/66 mmHg  Pulse 91  Temp(Src) 98.4 F (36.9 C) (Oral)  Resp 20  SpO2 100%  LMP 10/03/2015 (Approximate) Physical Exam  Constitutional: She is oriented to person, place, and time. She appears well-developed and well-nourished. No distress.  HENT:  Head: Normocephalic.  Nose: Mucosal edema present. Right sinus exhibits no maxillary sinus tenderness and no frontal sinus tenderness. Left sinus exhibits no maxillary sinus tenderness and no frontal sinus tenderness.  Mouth/Throat: Oropharynx is clear and moist and mucous membranes are normal. No oropharyngeal exudate, posterior oropharyngeal edema or tonsillar abscesses.  Eyes: Conjunctivae are normal.  Neck: Neck supple. No  tracheal deviation present.  Cardiovascular: Normal rate, regular rhythm and normal heart sounds.   Pulmonary/Chest: Effort normal and breath sounds normal. No respiratory distress.  Abdominal: Soft. She exhibits no distension.  Neurological: She is  alert and oriented to person, place, and time.  Skin: Skin is warm and dry.  Psychiatric: She has a normal mood and affect.    ED Course  Procedures (including critical care time) Labs Review Labs Reviewed - No data to display  Imaging Review Dg Chest 2 View  10/13/2015  CLINICAL DATA:  24 year old female with nausea vomiting. Dry cough and low back pain. EXAM: CHEST  2 VIEW COMPARISON:  Radiograph dated 07/02/2013 and chest radiograph dated 07/04/2012 FINDINGS: The heart size and mediastinal contours are within normal limits. Both lungs are clear. The visualized skeletal structures are unremarkable. IMPRESSION: No active cardiopulmonary disease. Electronically Signed   By: Elgie CollardArash  Radparvar M.D.   On: 10/13/2015 00:01   I have personally reviewed and evaluated these images and lab results as part of my medical decision-making.   EKG Interpretation None      MDM   Final diagnoses:  Viral URI with cough    24 year old female presents with ongoing upper respiratory symptoms. Fevers have resolved, otherwise well-appearing despite continued discomfort from nasal congestion and inability to sleep. She has been abusing her steroid nasal spray likely resulting in inadvertent tissue swelling from overuse worsening her congestion. I recommended she use a spray as directed, was given Afrin here to help with nasal congestion in the short term and was told not to use for longer than 3 days and no more than twice a day. Aspirin and Tylenol provided for frontal headache likely secondary to ongoing congestion and cough, Decadron provided for sore throat and prevention of rebound headache.    Lyndal Pulleyaniel Efosa Treichler, MD 10/14/15 [phone] [id number]

## 2015-10-14 NOTE — ED Notes (Signed)
Pt verbalized understanding of d/c instructions, prescriptions, and follow-up care. No further questions/concerns, VSS, ambulatory w/ steady gait (refused wheelchair) 

## 2015-10-14 NOTE — Discharge Instructions (Signed)
Upper Respiratory Infection, Adult Most upper respiratory infections (URIs) are a viral infection of the air passages leading to the lungs. A URI affects the nose, throat, and upper air passages. The most common type of URI is nasopharyngitis and is typically referred to as "the common cold." URIs run their course and usually go away on their own. Most of the time, a URI does not require medical attention, but sometimes a bacterial infection in the upper airways can follow a viral infection. This is called a secondary infection. Sinus and middle ear infections are common types of secondary upper respiratory infections. Bacterial pneumonia can also complicate a URI. A URI can worsen asthma and chronic obstructive pulmonary disease (COPD). Sometimes, these complications can require emergency medical care and may be life threatening.  CAUSES Almost all URIs are caused by viruses. A virus is a type of germ and can spread from one person to another.  RISKS FACTORS You may be at risk for a URI if:   You smoke.   You have chronic heart or lung disease.  You have a weakened defense (immune) system.   You are very young or very old.   You have nasal allergies or asthma.  You work in crowded or poorly ventilated areas.  You work in health care facilities or schools. SIGNS AND SYMPTOMS  Symptoms typically develop 2-3 days after you come in contact with a cold virus. Most viral URIs last 7-10 days. However, viral URIs from the influenza virus (flu virus) can last 14-18 days and are typically more severe. Symptoms may include:   Runny or stuffy (congested) nose.   Sneezing.   Cough.   Sore throat.   Headache.   Fatigue.   Fever.   Loss of appetite.   Pain in your forehead, behind your eyes, and over your cheekbones (sinus pain).  Muscle aches.  DIAGNOSIS  Your health care provider may diagnose a URI by:  Physical exam.  Tests to check that your symptoms are not due to  another condition such as:  Strep throat.  Sinusitis.  Pneumonia.  Asthma. TREATMENT  A URI goes away on its own with time. It cannot be cured with medicines, but medicines may be prescribed or recommended to relieve symptoms. Medicines may help:  Reduce your fever.  Reduce your cough.  Relieve nasal congestion. HOME CARE INSTRUCTIONS   Take medicines only as directed by your health care provider.   Gargle warm saltwater or take cough drops to comfort your throat as directed by your health care provider.  Use a warm mist humidifier or inhale steam from a shower to increase air moisture. This may make it easier to breathe.  Drink enough fluid to keep your urine clear or pale yellow.   Eat soups and other clear broths and maintain good nutrition.   Rest as needed.   Return to work when your temperature has returned to normal or as your health care provider advises. You may need to stay home longer to avoid infecting others. You can also use a face mask and careful hand washing to prevent spread of the virus.  Increase the usage of your inhaler if you have asthma.   Do not use any tobacco products, including cigarettes, chewing tobacco, or electronic cigarettes. If you need help quitting, ask your health care provider. PREVENTION  The best way to protect yourself from getting a cold is to practice good hygiene.   Avoid oral or hand contact with people with cold   symptoms.   Wash your hands often if contact occurs.  There is no clear evidence that vitamin C, vitamin E, echinacea, or exercise reduces the chance of developing a cold. However, it is always recommended to get plenty of rest, exercise, and practice good nutrition.  SEEK MEDICAL CARE IF:   You are getting worse rather than better.   Your symptoms are not controlled by medicine.   You have chills.  You have worsening shortness of breath.  You have brown or red mucus.  You have yellow or brown nasal  discharge.  You have pain in your face, especially when you bend forward.  You have a fever.  You have swollen neck glands.  You have pain while swallowing.  You have white areas in the back of your throat. SEEK IMMEDIATE MEDICAL CARE IF:   You have severe or persistent:  Headache.  Ear pain.  Sinus pain.  Chest pain.  You have chronic lung disease and any of the following:  Wheezing.  Prolonged cough.  Coughing up blood.  A change in your usual mucus.  You have a stiff neck.  You have changes in your:  Vision.  Hearing.  Thinking.  Mood. MAKE SURE YOU:   Understand these instructions.  Will watch your condition.  Will get help right away if you are not doing well or get worse.   This information is not intended to replace advice given to you by your health care provider. Make sure you discuss any questions you have with your health care provider.   Document Released: 04/25/2001 Document Revised: 03/16/2015 Document Reviewed: 02/04/2014 Elsevier Interactive Patient Education 2016 [employer]  

## 2015-10-20 ENCOUNTER — Emergency Department (HOSPITAL_COMMUNITY): Payer: Self-pay

## 2015-10-20 ENCOUNTER — Encounter (HOSPITAL_COMMUNITY): Payer: Self-pay | Admitting: Emergency Medicine

## 2015-10-20 ENCOUNTER — Emergency Department (HOSPITAL_COMMUNITY)
Admission: EM | Admit: 2015-10-20 | Discharge: 2015-10-20 | Disposition: A | Discharge status: Home / Self Care | Payer: Self-pay | Attending: Emergency Medicine | Admitting: Emergency Medicine

## 2015-10-20 DIAGNOSIS — Y9389 Activity, other specified: Secondary | ICD-10-CM | POA: Insufficient documentation

## 2015-10-20 DIAGNOSIS — W1839XA Other fall on same level, initial encounter: Secondary | ICD-10-CM | POA: Insufficient documentation

## 2015-10-20 DIAGNOSIS — S8252XA Displaced fracture of medial malleolus of left tibia, initial encounter for closed fracture: Secondary | ICD-10-CM | POA: Insufficient documentation

## 2015-10-20 DIAGNOSIS — Z7951 Long term (current) use of inhaled steroids: Secondary | ICD-10-CM | POA: Insufficient documentation

## 2015-10-20 DIAGNOSIS — Y9289 Other specified places as the place of occurrence of the external cause: Secondary | ICD-10-CM | POA: Insufficient documentation

## 2015-10-20 DIAGNOSIS — S82892A Other fracture of left lower leg, initial encounter for closed fracture: Secondary | ICD-10-CM

## 2015-10-20 DIAGNOSIS — S8262XA Displaced fracture of lateral malleolus of left fibula, initial encounter for closed fracture: Secondary | ICD-10-CM | POA: Insufficient documentation

## 2015-10-20 DIAGNOSIS — Y998 Other external cause status: Secondary | ICD-10-CM | POA: Insufficient documentation

## 2015-10-20 MED ORDER — IBUPROFEN 400 MG PO TABS
800.0000 mg | ORAL_TABLET | Freq: Once | ORAL | Status: AC
  Administered 2015-10-20: 800 mg via ORAL
  Filled 2015-10-20: qty 2

## 2015-10-20 MED ORDER — IBUPROFEN 600 MG PO TABS: 600.0000 mg | ORAL_TABLET | Freq: Four times a day (QID) | ORAL | Status: DC | PRN

## 2015-10-20 NOTE — ED Notes (Signed)
The pt reports that she fell last Thursday pain.  C/o pain in both knees.  With abrasiions
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
## 2015-10-20 NOTE — Discharge Instructions (Signed)
1. Medications: motrin, usual home medications 2. Treatment: rest, drink plenty of fluids, ice, elevate 3. Follow Up: please followup with Dr. Renaye Rakersim Murphy on Friday at 8:30 AM for discussion of your diagnoses and further evaluation after today's visit; if you do not have a primary care doctor use the resource guide provided to find one; please return to the ER for severe pain, weakness, numbness, new or worsening symptoms    Emergency Department Resource Guide 1) Find a Doctor and Pay Out of Pocket Although you won't have to find out who is covered by your insurance plan, it is a good idea to ask around and get recommendations. You will then need to call the office and see if the doctor you have chosen will accept you as a new patient and what types of options they offer for patients who are self-pay. Some doctors offer discounts or will set up payment plans for their patients who do not have insurance, but you will need to ask so you aren't surprised when you get to your appointment.  2) Contact Your Local Health Department Not all health departments have doctors that can see patients for sick visits, but many do, so it is worth a call to see if yours does. If you don't know where your local health department is, you can check in your phone book. The CDC also has a tool to help you locate your state's health department, and many state websites also have listings of all of their local health departments.  3) Find a Walk-in Clinic If your illness is not likely to be very severe or complicated, you may want to try a walk in clinic. These are popping up all over the country in pharmacies, drugstores, and shopping centers. They're usually staffed by nurse practitioners or physician assistants that have been trained to treat common illnesses and complaints. They're usually fairly quick and inexpensive. However, if you have serious medical issues or chronic medical problems, these are probably not your best  option.  No Primary Care Doctor: - Call Health Connect at  [phone] - they can help you locate a primary care doctor that  accepts your insurance, provides certain services, etc. - Physician Referral Service- [phone]  Chronic Pain Problems: Organization         Address  Phone   Notes  [facility]  [phone] Patients need to be referred by their primary care doctor.   Medication Assistance: Organization         Address  Phone   Notes  [facility] Medication [facility] [address] [place], [place] [address] [phone] --Must be a resident of [place] -- Must have NO insurance coverage whatsoever (no Medicaid/ Medicare, etc.) -- The pt. MUST have a primary care doctor that directs their care regularly and follows them in the community   MedAssist  [phone]   [employer]  [phone]    Agencies that provide inexpensive medical care: Organization         Address  Phone   Notes  [facility]  [phone]   [facility]    [phone]   [facility] [address] [place], [place] [address] [phone]   [facility] [address], [place] [phone]   Planned Parenthood    [phone]   [facility]    [phone]   Community Health  and [address], [place] Phone:  [phone], Fax:  [phone] Hours of Operation:  9 am - 6 pm, M-F.  Also accepts Medicaid/Medicare and self-pay.  [facility] for [address] [place], [address], [place] Phone: [phone], Fax: [phone]. Hours of Operation:  8:30 am - 5:30 pm, M-F.  Also accepts Medicaid and self-pay.  [facility] [place] [address], [place] Phone: [phone]   [place], [place], [place] [phone], Ext. [phone] Mondays & Thursdays: 7-9 AM.  First 15  patients are seen on a first come, first serve basis.    [place] Providers:  Organization         Address  Phone   Notes  [facility] [address], [place] [phone] Also accepts self-pay patients.  [facility] [id number] [place], [place]  [phone]   [address], [place] [phone]   [facility] [address] [place] [phone]   Lucianne Lei [address], [place]   [phone] Only accepts [place] Access [place] patients after they have their name applied to their card.   Self-Pay (no insurance) in [facility]:  Organization         Address  Phone   Notes  Sickle Cell Patients, [facility] [place] [phone]   [facility] [place] [phone]   [facility] [place]  [place], [place], [place] [phone]   [facility]/Dr. Osei-Bonsu  [address] [place] or [address], [place] [phone] Phone number for both [place] and [place] locations is the same.  [facility] [address] [place] [phone]   [facility] [address] [place] or [address] [phone]   [facility] [address] [place] [phone], phone; [phone], fax Sees patients 1st and 3rd Saturday of every month.  Must not qualify for public or private insurance (i.e. Medicaid, Medicare, [place] Health Choice, Veterans' Benefits)  Household income should be no more than 200% of the poverty level The clinic cannot treat you if you are pregnant or think you are pregnant  Sexually transmitted diseases are not treated at the clinic.    Dental  Care: Organization         Address  Phone  Notes  [facility] [place] [phone] Accepts children up to age 36 who are enrolled in [place] or [place]; pregnant women with a Medicaid card; and children who have applied for Medicaid or [place] Health Choice, but were declined, whose parents can pay a reduced fee at time of service.  [facility] of [facility]  [address], [place] [phone] Accepts children up to age 37 who are enrolled in [place] or [place]; pregnant women with a Medicaid card; and children who have applied for Medicaid or [place] Health Choice, but were declined, whose parents can pay a reduced fee at time of service.  [facility]  [address]  Mardene Speak [phone] Patients are seen by appointment only. Walk-ins are not accepted. [place] will see patients 41 years of age and older. Monday - Tuesday (8am-5pm) Most Wednesdays (8:30-5pm) $30 per visit, cash only  [facility] Access PROGRAM  [address], [facility] [phone] Patients are seen by appointment only. Walk-ins are not accepted. [place] will see patients 39 years of age and older. One Wednesday Evening (Monthly: Volunteer Based).  $30 per visit, cash only  [place]  [phone] for adults; Children under age 55, call Graduate Pediatric Dentistry at [phone]. Children aged 51-14, please call [phone] to request a pediatric application.  Dental services are provided in all areas of dental care including fillings, crowns and bridges, complete and partial dentures, implants, gum treatment, root canals, and extractions. Preventive care is also provided. Treatment is provided to both adults and children. Patients are selected via a lottery and there is often a waiting list.   [facility] [address], [place]  [phone] [email or web address]   [facility] [address] [place], [place] [phone], Ext. [phone] Second and Fourth Thursday of each month, opens at 6:30 AM; Clinic ends at 9 AM.  Patients are seen on a first-come first-served basis, and a limited number are seen during each clinic.   [facility]  [address] Hillard Danker [place], [place] [phone]   Eligibility Requirements You must have lived in [place], [place], or [place] counties for at least the last three months.   You cannot be eligible for state or federal sponsored [employer], including [employer], [place], or [employer].   You generally cannot be eligible for healthcare insurance through your employer.    How to apply: Eligibility screenings are held every Tuesday and Wednesday afternoon from 1:00 pm until 4:00 pm. You do not need an appointment for the interview!  [facility] [address], [place], [place]   [place]  [place] Department  [place]  [phone]    Behavioral Health Resources in the Community: Intensive Outpatient Programs Organization         Address  Phone  Notes  [place]. [address], [place], [place] [phone]   [facility] Outpatient [address] [place], [place]   ADS: Alcohol & Drug Svcs [address], [place], [place]   [place] [address]  [place], [place] or [phone]   Substance Abuse Resources Organization         Address  Phone  Notes  Alcohol and Drug Services  [phone]   [place]  [phone]   [facility] [place]   Chinita Pester  [phone]   Residential & Outpatient Substance Abuse Program  [phone]    Psychological Services Organization         Address  Phone  Notes  [facility] [place]  [place]  [phone]   [place] [address] [place] or [phone]    Mobile Crisis Teams Organization         Address  Phone  Notes  [facility], Mobile Crisis Care Unit  [phone]   [facility]ive Psychotherapeutic Services  [address] [place], [place]   [place]  Interior [phone]    Self-Help/Support Groups Organization         Address  Phone             Notes  [facility] [place] - variety of support groups  [place] Call for more information  Narcotics Anonymous (NA), [facility] [address], [employer] [place]  2 meetings at this location   Special educational needs teacher         Address  Phone  Notes  [facility] Residential Treatment [place],    [place]  [phone]   [facility]  [address], [place] [id number], [place], [place]   [place], [place] [phone] Admissions: 8am-3pm M-F  Incentives Substance [place] [address]    [place], [place] [phone]   [facility] [address] [place], [place], [place]   [facility] [address]  [place], [place]   [facility] - Intensive Outpatient [address], [place], [place]   [facility] ([place].) [place].,  [place], [place] [phone] or [phone]   [facility] (RTS) [address], [place], [place] Accepts Medicaid  [facility] [place] [address]  [place] 1-2280100179 Substance Abuse/Addiction Treatment   [facility] Organization         Address  Phone  Notes  [facility]  [phone]   Domenic Schwab, PhD [address] Arlis Porta [place], [place]   [phone] or [phone]   Martin Buck[address] [place], [place] [phone]   [facility] [address] [place], [place] [phone] Insurance/Medicaid/sponsorship through [facility] and Families [address], [address] [place]                                    [place], [place] [phone] [place] [address], [place] [phone]    Dr. Adele Schilder  [phone]   [facility] Dept. 1) [address] [place] 2) [place] 3)  [place] 65, [place] 325 [phone]  [phone]   [place] [phone] or [phone] (After Hours)
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
## 2015-11-05 ENCOUNTER — Encounter (HOSPITAL_COMMUNITY): Payer: Self-pay | Admitting: *Deleted

## 2015-11-05 ENCOUNTER — Emergency Department (HOSPITAL_COMMUNITY)
Admission: EM | Admit: 2015-11-05 | Discharge: 2015-11-05 | Disposition: A | Discharge status: Home / Self Care | Payer: Self-pay | Attending: Emergency Medicine | Admitting: Emergency Medicine

## 2015-11-05 DIAGNOSIS — Z7951 Long term (current) use of inhaled steroids: Secondary | ICD-10-CM | POA: Insufficient documentation

## 2015-11-05 DIAGNOSIS — N946 Dysmenorrhea, unspecified: Secondary | ICD-10-CM | POA: Insufficient documentation

## 2015-11-05 DIAGNOSIS — R197 Diarrhea, unspecified: Secondary | ICD-10-CM

## 2015-11-05 DIAGNOSIS — R6883 Chills (without fever): Secondary | ICD-10-CM | POA: Insufficient documentation

## 2015-11-05 DIAGNOSIS — Z3202 Encounter for pregnancy test, result negative: Secondary | ICD-10-CM | POA: Insufficient documentation

## 2015-11-05 DIAGNOSIS — R112 Nausea with vomiting, unspecified: Secondary | ICD-10-CM

## 2015-11-05 LAB — COMPREHENSIVE METABOLIC PANEL
ALT: 15 U/L (ref 14–54)
AST: 22 U/L (ref 15–41)
Albumin: 4.7 g/dL (ref 3.5–5.0)
Alkaline Phosphatase: 87 U/L (ref 38–126)
Anion gap: 12 (ref 5–15)
BUN: 12 mg/dL (ref 6–20)
CO2: 21 mmol/L — ABNORMAL LOW (ref 22–32)
Calcium: 9.9 mg/dL (ref 8.9–10.3)
Chloride: 106 mmol/L (ref 101–111)
Creatinine, Ser: 0.61 mg/dL (ref 0.44–1.00)
GFR calc Af Amer: >60 mL/min (ref >60)
GFR calc non Af Amer: >60 mL/min (ref >60)
Glucose, Bld: 98 mg/dL (ref 65–99)
Potassium: 4 mmol/L (ref 3.5–5.1)
Sodium: 139 mmol/L (ref 135–145)
Total Bilirubin: 0.5 mg/dL (ref 0.3–1.2)
Total Protein: 8.7 g/dL — ABNORMAL HIGH (ref 6.5–8.1)

## 2015-11-05 LAB — CBC WITH DIFFERENTIAL/PLATELET
Basophils Absolute: 0 10*3/uL (ref 0.0–0.1)
Basophils Relative: 0 %
Eosinophils Absolute: 0 10*3/uL (ref 0.0–0.7)
Eosinophils Relative: 0 %
HCT: 37.6 % (ref 36.0–46.0)
Hemoglobin: 12 g/dL (ref 12.0–15.0)
Lymphocytes Relative: 7 %
Lymphs Abs: 1.1 10*3/uL (ref 0.7–4.0)
MCH: 26 pg (ref 26.0–34.0)
MCHC: 31.9 g/dL (ref 30.0–36.0)
MCV: 81.4 fL (ref 78.0–100.0)
Monocytes Absolute: 0.8 10*3/uL (ref 0.1–1.0)
Monocytes Relative: 5 %
Neutro Abs: 14.3 10*3/uL — ABNORMAL HIGH (ref 1.7–7.7)
Neutrophils Relative %: 88 %
Platelets: 521 10*3/uL — ABNORMAL HIGH (ref 150–400)
RBC: 4.62 MIL/uL (ref 3.87–5.11)
RDW: 14.9 % (ref 11.5–15.5)
WBC: 16.2 10*3/uL — ABNORMAL HIGH (ref 4.0–10.5)

## 2015-11-05 LAB — POC URINE PREG, ED: Preg Test, Ur: NEGATIVE

## 2015-11-05 LAB — LIPASE, BLOOD: Lipase: 25 U/L (ref 11–51)

## 2015-11-05 MED ORDER — ONDANSETRON 4 MG PO TBDP: ORAL_TABLET | ORAL | Status: DC

## 2015-11-05 MED ORDER — SODIUM CHLORIDE 0.9 % IV BOLUS (SEPSIS)
1000.0000 mL | Freq: Once | INTRAVENOUS | Status: AC
  Administered 2015-11-05: 1000 mL via INTRAVENOUS

## 2015-11-05 MED ORDER — ONDANSETRON HCL 4 MG/2ML IJ SOLN
4.0000 mg | Freq: Once | INTRAMUSCULAR | Status: AC
  Administered 2015-11-05: 4 mg via INTRAVENOUS
  Filled 2015-11-05: qty 2

## 2015-11-05 MED ORDER — KETOROLAC TROMETHAMINE 30 MG/ML IJ SOLN
30.0000 mg | Freq: Once | INTRAMUSCULAR | Status: AC
  Administered 2015-11-05: 30 mg via INTRAVENOUS
  Filled 2015-11-05: qty 1

## 2015-11-05 NOTE — ED Notes (Signed)
Attempted to get EKG put patient refused to lay on back, stated pain is 10/10

## 2015-11-05 NOTE — ED Notes (Signed)
V/S still delayed due to pt being in the bathroom. Pt actively vomiting and having diarrhea Pt c/o abdominal pain 10/10

## 2015-11-05 NOTE — ED Notes (Signed)
VS delayed d/t pt needing to use the bathroom.

## 2015-11-05 NOTE — ED Notes (Signed)
Per PTAR report: pt having her month menstrual cramp pain. Menstrual cramp is no different than any other month.  Pt has no other symptoms.  Rates pain 10/10.

## 2015-11-05 NOTE — Progress Notes (Signed)
EDCM spoke to patient at bedside. Patient confirms she does not have a pcp or insurance [place].  [facility] with contact [facility]ation to [facility], informed patient of services there.  EDCM also provided patient with list of pcps who accept self pay patients, list of discount pharmacies and websites needymeds.org and GoodRX.com for medication assistance, phone number to inquire about the orange card, phone number to inquire about Mediciad, phone number to inquire about the Affordable Care Act, financial resources in the community such as local churches, salvation army, urban ministries, and dental assistance for uninsured patients.  Patient thankful for resources.  No further EDCM needs at this time.  Patient reports with her new job she should have insurance at the beginning of the year.

## 2015-11-05 NOTE — ED Provider Notes (Signed)
CSN: [id number]     Arrival date & time 11/05/15  1849 History   First MD Initiated Contact with Patient 11/05/15 1857     Chief Complaint  Patient presents with  . Menstrual Problem     (Consider location/radiation/quality/duration/timing/severity/associated sxs/prior Treatment) HPI  24 year old female presents with severe lower abdominal pain. Started this morning and feels like her menstrual cramping except is more severe. This is the time of the month that her period would come. Patient states the bleeding has not been heavier than normal. No urinary symptoms. Has been having vomiting and diarrhea, often at the same time throughout the day since waking up. No hematemesis or hematochezia. States her abdominal pain is localized at the lower abdomen. No lateral symptoms. Laying on her stomach seems to help her pain and that is currently how she is laying. Tried ibuprofen for the pain but threw it up.  History reviewed. No pertinent past medical history. History reviewed. No pertinent past surgical history. No family history on file. Social History  Substance Use Topics  . Smoking status: Never Smoker   . Smokeless tobacco: Never Used  . Alcohol Use: Yes   OB History    No data available     Review of Systems  Constitutional: Positive for chills. Negative for fever.  Gastrointestinal: Positive for nausea, vomiting, abdominal pain and diarrhea. Negative for blood in stool.  Genitourinary: Positive for vaginal bleeding. Negative for dysuria.  Neurological: Positive for light-headedness.  All other systems reviewed and are negative.     Allergies  Review of patient's allergies indicates no known allergies.  Home Medications   Prior to Admission medications   Medication Sig Start Date End Date Taking? Authorizing Provider  benzonatate (TESSALON) 100 MG capsule Take 1 capsule (100 mg total) by mouth 3 (three) times daily as needed for cough. 10/13/15   Antony MaduraKelly Humes, PA-C   fluticasone (FLONASE) 50 MCG/ACT nasal spray Place 2 sprays into both nostrils daily. 10/13/15   Antony MaduraKelly Humes, PA-C  HYDROcodone-acetaminophen (NORCO/VICODIN) 5-325 MG per tablet Take 1-2 tablets by mouth every 6 (six) hours as needed. Patient not taking: Reported on 01/13/2015 12/15/14   Roxy Horsemanobert Browning, PA-C  ibuprofen (ADVIL,MOTRIN) 600 MG tablet Take 1 tablet (600 mg total) by mouth every 6 (six) hours as needed. 10/20/15   Mady GemmaElizabeth C Westfall, PA-C  methocarbamol (ROBAXIN) 500 MG tablet Take 1 tablet (500 mg total) by mouth 2 (two) times daily. Patient not taking: Reported on 10/14/2015 07/12/15   Santiago GladHeather Laisure, PA-C  naproxen (NAPROSYN) 500 MG tablet Take 1 tablet (500 mg total) by mouth 2 (two) times daily. Patient not taking: Reported on 10/14/2015 07/12/15   Santiago GladHeather Laisure, PA-C  ondansetron (ZOFRAN ODT) 4 MG disintegrating tablet Take 1 tablet (4 mg total) by mouth every 8 (eight) hours as needed for nausea or vomiting. Patient not taking: Reported on 10/14/2015 10/13/15   Antony MaduraKelly Humes, PA-C   BP 120/81 mmHg  Pulse 72  Temp(Src) 98.4 F (36.9 C) (Oral)  Resp 18  SpO2 99%  LMP 11/05/2015 Physical Exam  Constitutional: She is oriented to person, place, and time. She appears well-developed and well-nourished.  HENT:  Head: Normocephalic and atraumatic.  Right Ear: External ear normal.  Left Ear: External ear normal.  Nose: Nose normal.  Eyes: Right eye exhibits no discharge. Left eye exhibits no discharge.  Cardiovascular: Normal rate, regular rhythm and normal heart sounds.   Pulmonary/Chest: Effort normal and breath sounds normal.  Abdominal: Soft. There is tenderness.  Diffuse abdominal pain, worse suprapubic. Difficult exam due to patient guarding but also trying to roll back on her stomach as this helps her pain  Neurological: She is alert and oriented to person, place, and time.  Skin: Skin is warm and dry.  Nursing note and vitals reviewed.   ED Course  Procedures  (including critical care time) Labs Review Labs Reviewed  COMPREHENSIVE METABOLIC PANEL - Abnormal; Notable for the following:    CO2 21 (*)    Total Protein 8.7 (*)    All other components within normal limits  CBC WITH DIFFERENTIAL/PLATELET - Abnormal; Notable for the following:    WBC 16.2 (*)    Platelets 521 (*)    Neutro Abs 14.3 (*)    All other components within normal limits  WET PREP, GENITAL  LIPASE, BLOOD  URINALYSIS, ROUTINE W REFLEX MICROSCOPIC (NOT AT [facility])  POC URINE PREG, ED  GC/CHLAMYDIA PROBE AMP ([place]) NOT AT [facility]    Imaging Review No results found. I have personally reviewed and evaluated these images and lab results as part of my medical decision-making.   EKG Interpretation   Date/Time:  Friday November 05 2015 21:49:45 EST Ventricular Rate:  81 PR Interval:  140 QRS Duration: 70 QT Interval:  398 QTC Calculation: 462 R Axis:   21 Text Interpretation:  Normal sinus rhythm Normal ECG No old tracing to  compare Confirmed by Lasandra Batley  MD, Jemmie Rhinehart ([id number]) on 11/05/2015 9:59:29 PM      MDM   Final diagnoses:  Nausea vomiting and diarrhea  Severe menstrual cramps    After toradol and zofran patient feels much better. She now has only mild suprapubic tenderness, no other focal tenderness. She endorses to me that she was having cramping diffusely that was better with BMs and likely is part of a viral GI illness on top of her already severe cramping from menstrual cycles. She states she always has to take off from work due to the severity of her cycles and currently this pain is similar. No vomiting. No urinary symptoms. Lab did not have enough urine to run UA but no symptoms, highly doubt UTI. She declines pelvic exam. No lateralizing symptoms to be concerned about TOA or torsion. Given she is much better and states her periods are always quite painful, I feel she is stable for D/C with NSAIDs, anti-emetics, and f/u with GYN. Discussed return  precautions.    Pricilla Loveless, MD 11/05/15 [phone]

## 2015-11-05 NOTE — Progress Notes (Signed)
EDCM provided patient with coupon from  [email or web address] to assist with cost of zofran prescription cost.  Patient thankful for assistance.  No further EDCm needs at this time.

## 2016-03-03 IMAGING — DX DG ANKLE COMPLETE 3+V*L*
3 series · 3 of 3 positions shown · non-contrast
Comparison: None.

CLINICAL DATA: Pain following fall 6 days prior

EXAM:
LEFT ANKLE COMPLETE - 3+ VIEW

[x ankle ap left]
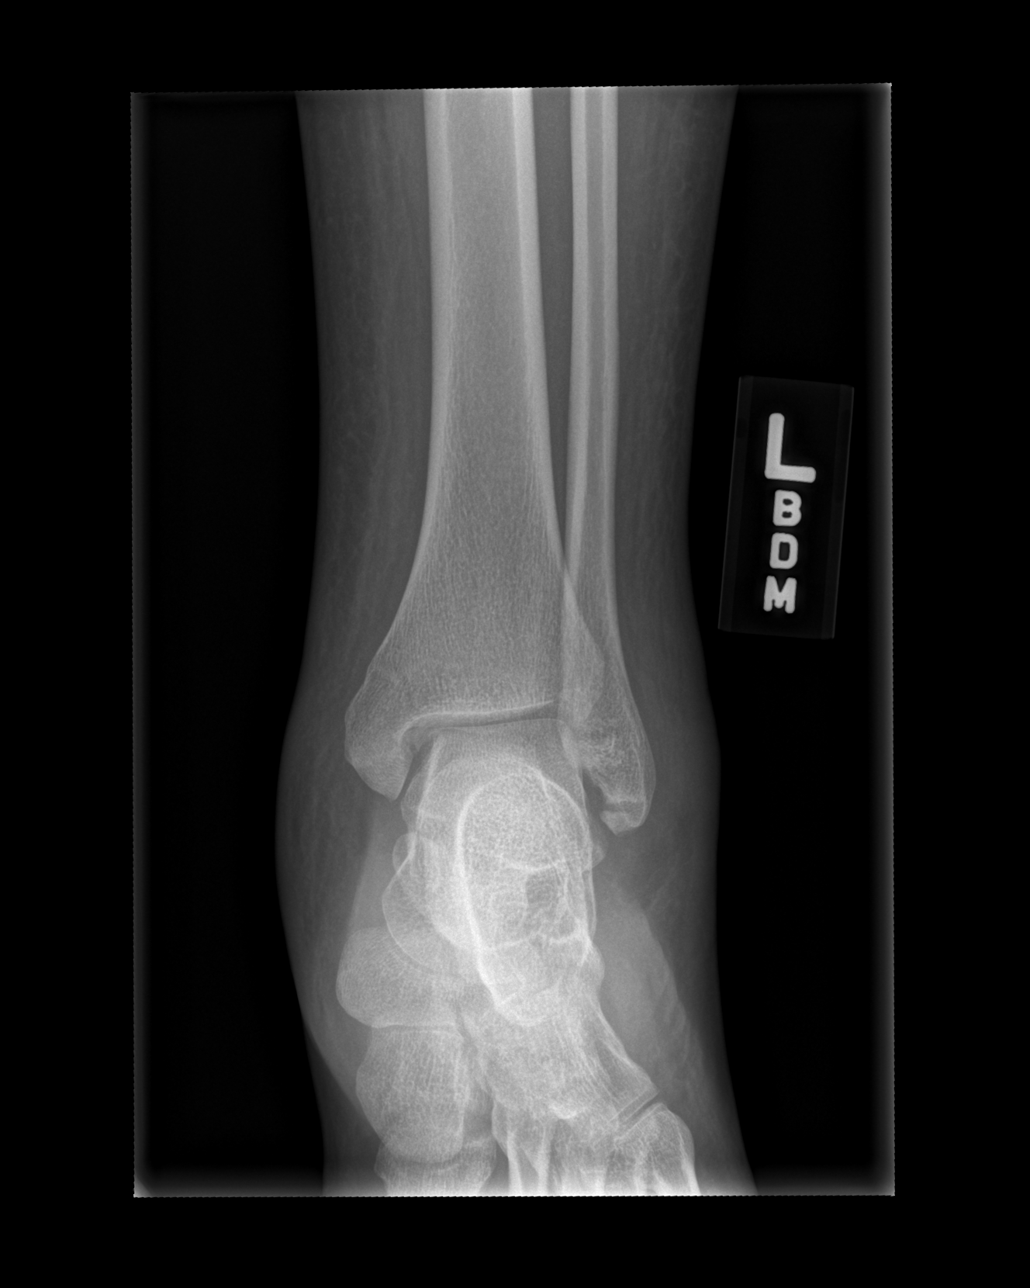

[x ankle obl left]
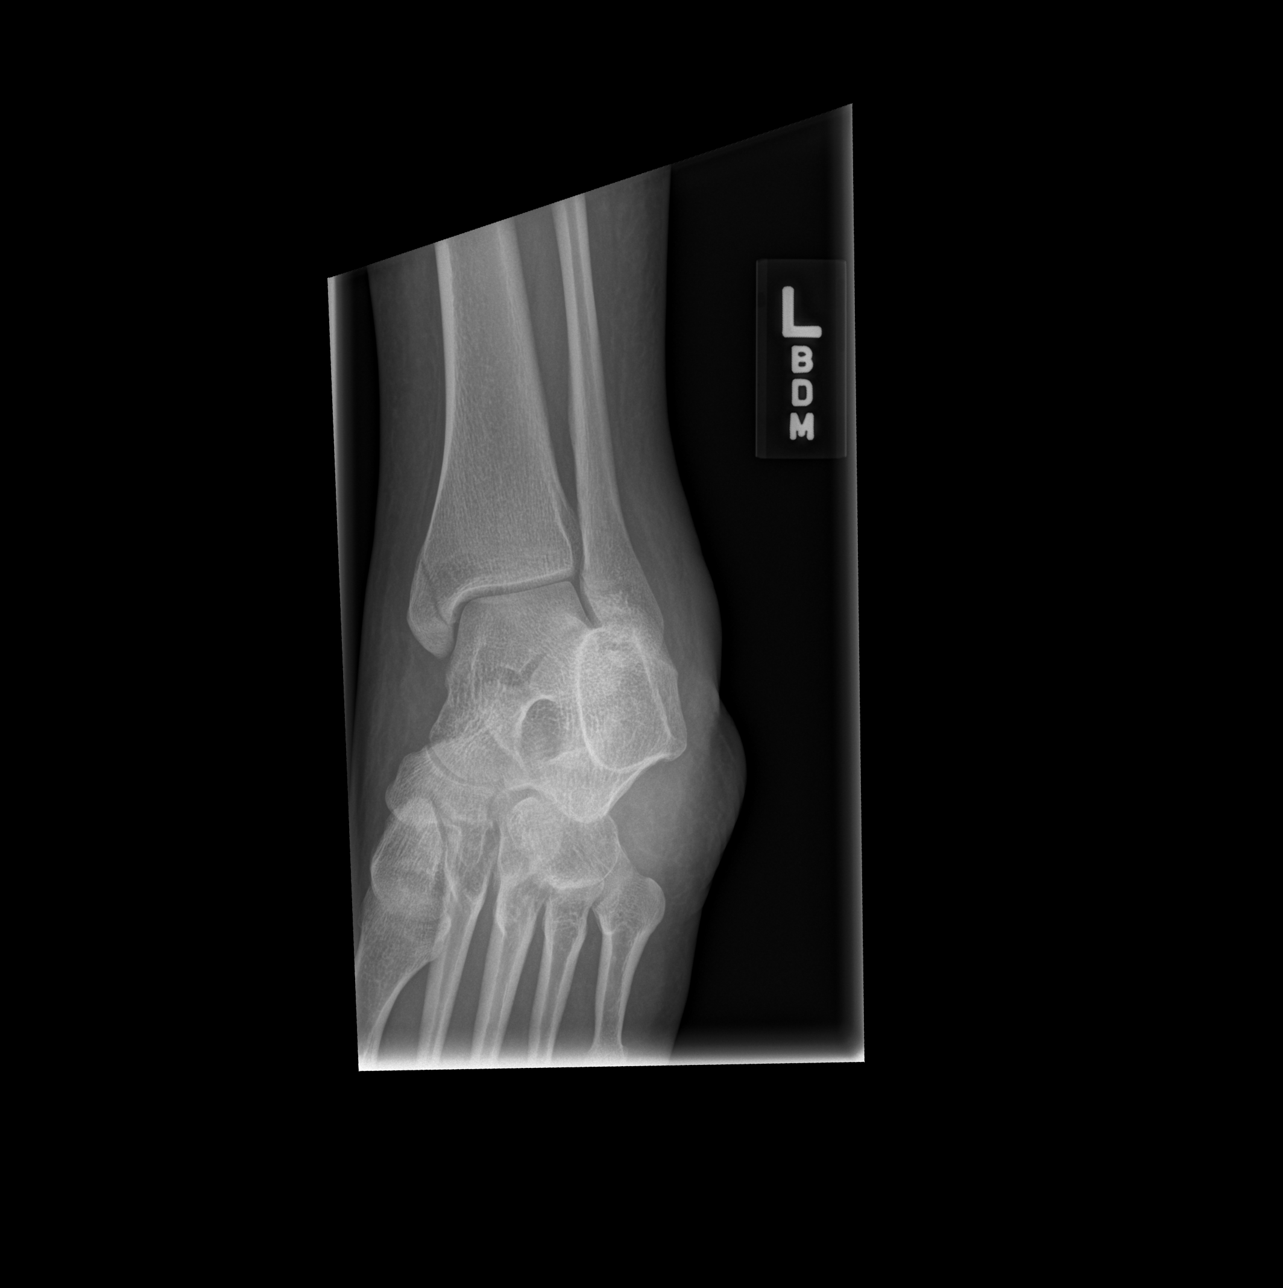

[x ankle lat left]
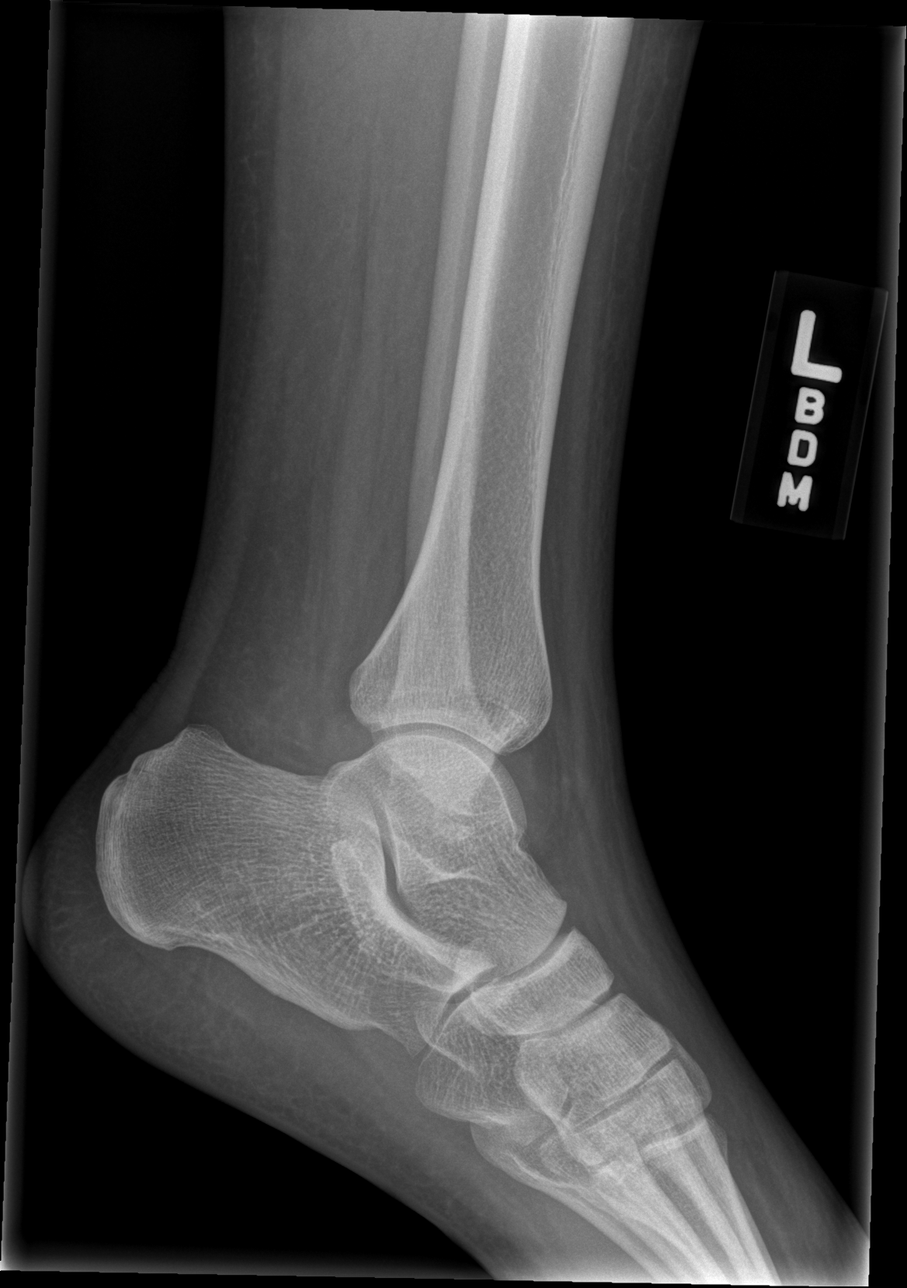

[3 of 3 positions shown; findings below may reference images not displayed]

FINDINGS: There is marked soft tissue swelling. There is an avulsion type
fracture along the inferior aspect the lateral malleolus. There is
an obliquely oriented fracture at the medial malleolar level,
nondisplaced. There is a small ankle joint effusion. The ankle
mortise appears grossly intact. There is no appreciable joint space
narrowing.
IMPRESSION: Medial and lateral malleolar fractures with small joint effusion.
Generalized soft tissue swelling. Ankle mortise appears grossly
intact.

## 2016-05-06 ENCOUNTER — Encounter (HOSPITAL_COMMUNITY): Payer: Self-pay | Admitting: *Deleted

## 2016-05-06 ENCOUNTER — Emergency Department (HOSPITAL_COMMUNITY)
Admission: EM | Admit: 2016-05-06 | Discharge: 2016-05-06 | Disposition: A | Discharge status: Home / Self Care | Payer: No Typology Code available for payment source | Attending: Emergency Medicine | Admitting: Emergency Medicine

## 2016-05-06 DIAGNOSIS — R21 Rash and other nonspecific skin eruption: Secondary | ICD-10-CM | POA: Insufficient documentation

## 2016-05-06 MED ORDER — DIPHENHYDRAMINE HCL 25 MG PO CAPS
50.0000 mg | ORAL_CAPSULE | Freq: Once | ORAL | Status: AC
  Administered 2016-05-06: 50 mg via ORAL
  Filled 2016-05-06: qty 2

## 2016-05-06 MED ORDER — PREDNISONE 20 MG PO TABS
60.0000 mg | ORAL_TABLET | Freq: Once | ORAL | Status: AC
  Administered 2016-05-06: 60 mg via ORAL
  Filled 2016-05-06: qty 3

## 2016-05-06 MED ORDER — PREDNISONE 10 MG (21) PO TBPK: 10.0000 mg | ORAL_TABLET | Freq: Every day | ORAL | Status: DC

## 2016-05-06 NOTE — ED Provider Notes (Signed)
TIME SEEN: 6:50 AM  CHIEF COMPLAINT: Rash  HPI: Patient is a 25 y.o. female with no significant past mental history who presents emergency department with a rash on her arms and legs that started 2 days ago. States that she thinks she was bitten by a spider because she woke up and saw a spider near her. Denies any known history of bite. No new soaps, lotions, detergents, medications or foods. States the rash is pruritic. No tick bite. No fever. Her cousin who is at bedside lives with her and does not have similar symptoms. States she did get a new mattress approximately 6 months ago.  ROS: See HPI Constitutional: no fever  Eyes: no drainage  ENT: no runny nose   Cardiovascular:  no chest pain  Resp: no SOB  GI: no vomiting GU: no dysuria Integumentary: no rash  Allergy: no hives  Musculoskeletal: no leg swelling  Neurological: no slurred speech ROS otherwise negative  PAST MEDICAL HISTORY/PAST SURGICAL HISTORY:  History reviewed. No pertinent past medical history.  MEDICATIONS:  Prior to Admission medications   Medication Sig Start Date End Date Taking? Authorizing Provider  ibuprofen (ADVIL,MOTRIN) 600 MG tablet Take 1 tablet (600 mg total) by mouth every 6 (six) hours as needed. Patient taking differently: Take 600 mg by mouth every 6 (six) hours as needed for moderate pain.  10/20/15  Yes Mady GemmaElizabeth C Westfall, PA-C  naproxen sodium (ANAPROX) 220 MG tablet Take 440-660 mg by mouth 2 (two) times daily as needed (pain).   Yes Historical Provider, MD  ondansetron (ZOFRAN ODT) 4 MG disintegrating tablet 4mg  ODT q4 hours prn nausea/vomit 11/05/15  Yes Pricilla LovelessScott Goldston, MD    ALLERGIES:  No Known Allergies  SOCIAL HISTORY:  Social History  Substance Use Topics  . Smoking status: Never Smoker   . Smokeless tobacco: Never Used  . Alcohol Use: Yes    FAMILY HISTORY: No family history on file.  EXAM: BP 114/81 mmHg  Pulse 81  Temp(Src) 98 F (36.7 C) (Oral)  Resp 18  SpO2  100%  LMP 04/29/2016 CONSTITUTIONAL: Alert and oriented and responds appropriately to questions. Well-appearing; well-nourished HEAD: Normocephalic EYES: Conjunctivae clear, PERRL ENT: normal nose; no rhinorrhea; moist mucous membranes; No pharyngeal erythema or petechiae, no tonsillar hypertrophy or exudate, no uvular deviation, no trismus or drooling, normal phonation, no stridor, no dental caries or abscess noted, no Ludwig's angina, tongue sits flat in the bottom of the mouth; no angioedema, no lesions in her mouth or oropharynx NECK: Supple, no meningismus, no LAD  CARD: RRR; S1 and S2 appreciated; no murmurs, no clicks, no rubs, no gallops RESP: Normal chest excursion without splinting or tachypnea; breath sounds clear and equal bilaterally; no wheezes, no rhonchi, no rales, no hypoxia or respiratory distress, speaking full sentences ABD/GI: Normal bowel sounds; non-distended; soft, non-tender, no rebound, no guarding, no peritoneal signs BACK:  The back appears normal and is non-tender to palpation, there is no CVA tenderness EXT: Normal ROM in all joints; non-tender to palpation; no edema; normal capillary refill; no cyanosis, no calf tenderness or swelling    SKIN: Normal color for age and race; warm; scattered erythematous papular lesions to the arms and legs without surrounding induration, warmth, drainage. No bull's-eye rash. No petechiae or purpura. No blisters or desquamation. No rash on her palms, soles or mucous membranes. No hives. NEURO: Moves all extremities equally, sensation to light touch intact diffusely, cranial nerves II through XII intact PSYCH: The patient's mood and manner are  appropriate. Grooming and personal hygiene are appropriate.  MEDICAL DECISION MAKING: Patient with rash that looks like possible flea bites, bug bites. No sign of superimposed infection. No sign of life-threatening rash. No rash between her fingers and toes. Family at bedside lives in the same house  and do not have similar symptoms. This does not look like scabies. There is no burrowing. Have discussed with her that she will need to change her mattress or cover and plastic, contact an exterminator, wash all of her bedding, clothing, towels in hot water and dry with high heat. We'll discharge with prednisone taper to give symptomatic relief. Have advised her to continue using Benadryl for itching.  Discussed return precautions. She verbalized understanding and is comfortable with this plan.    At this time, I do not feel there is any life-threatening condition present. I have reviewed and discussed all results (EKG, imaging, lab, urine as appropriate), exam findings with patient. I have reviewed nursing notes and appropriate previous records.  I feel the patient is safe to be discharged home without further emergent workup. Discussed usual and customary return precautions. Patient and family (if present) verbalize understanding and are comfortable with this plan.  Patient will follow-up with their primary care provider. If they do not have a primary care provider, information for follow-up has been provided to them. All questions have been answered.      Layla MawKristen N Ward, DO 05/06/16 [phone]

## 2016-05-06 NOTE — Discharge Instructions (Signed)
You may use Benadryl 25-50 mg every 8 hours as needed for itching. This medication is found over-the-counter.   Bedbugs Bedbugs are tiny bugs that live in and around beds. They stay hidden during the day, and they come out at night and bite. Bedbugs need blood to live and grow. WHERE ARE BEDBUGS FOUND? Bedbugs can be found anywhere, whether a place is clean or dirty. They are most often found in places where many people come and go, such as hotels, shelters, dorms, and health care settings. It is also common for them to be found in homes where there are many birds or bats nearby. WHAT ARE BEDBUG BITES LIKE? A bedbug bite leaves a small red bump with a darker red dot in the middle. The bump may appear soon after a person is bitten or a day or more later. Bedbug bites usually do not hurt, but they may itch. Most people do not need treatment for bedbug bites. The bumps usually go away on their own in a few days. HOW DO I CHECK FOR BEDBUGS? Bedbugs are reddish-brown, oval, and flat. They range in size from 1 mm to 7 mm and they cannot fly. Look for bedbugs in these places:  On mattresses, bed frames, headboards, and box springs.  On drapes and curtains in bedrooms.  Under carpeting in bedrooms.  Behind electrical outlets.  Behind any wallpaper that is peeling.  Inside luggage. Also look for black or red spots or stains on or near the bed. Stains can come from bedbugs that have been crushed or from bedbug waste. WHAT SHOULD I DO IF I FIND BEDBUGS? When Traveling If you find bedbugs while traveling, check all of your possessions carefully before you bring them into your home. Consider throwing away anything that has bedbugs on it. At Home If you find bedbugs at home, your bedroom may need to be treated by a pest control expert. You may also need to throw away mattresses or luggage. To help keep bedbugs from coming back, consider taking these actions:  Put a plastic cover over your  mattress.  Wash your clothes and bedding in water that is hotter than 120F (48.9C) and dry them on a hot setting. Bedbugs are killed by high temperatures.  Vacuum often around the bed and in all of the cracks and crevices where the bugs might hide.  Carefully check all used furniture, bedding, or clothes that you bring into your home.  Eliminate bird nests and bat roosts that are near your home. In Your Bed If you find bedbugs in your bed, consider wearing pajamas that have long sleeves and pant legs. Bedbugs usually bite areas of the skin that are not covered.   This information is not intended to replace advice given to you by your health care provider. Make sure you discuss any questions you have with your health care provider.   Document Released: 12/02/2010 Document Revised: 03/16/2015 Document Reviewed: 10/26/2014 Elsevier Interactive Patient Education [employer]2016 Elsevier Inc.    To find a primary care or specialty doctor please call [phone] or [phone] to access "[facility] Find a Doctor Service."  You may also go on the [facility] website at [email or web address]  There are also multiple [facility],  and [facility] practices throughout the [place] that are frequently accepting new patients. You may find a clinic that is close to your home and contact them.  [facility] -  [address] [place] [phone]  [facility] in [place] (also locations in [place] and [place]) -  [address] [place] [id number] [phone]  [facility] -  [address] [place] [id number] [phone]

## 2016-05-06 NOTE — ED Notes (Signed)
The pt is c/o numerus bites all over her body she reports that it started on Thursday after she was bitten by a spider  She has spiders and centipedes in her apartment  lmp last week

## 2016-07-25 ENCOUNTER — Encounter (HOSPITAL_COMMUNITY): Payer: Self-pay | Admitting: Emergency Medicine

## 2016-07-25 ENCOUNTER — Emergency Department (HOSPITAL_COMMUNITY)
Admission: EM | Admit: 2016-07-25 | Discharge: 2016-07-25 | Disposition: A | Discharge status: Home / Self Care | Payer: Self-pay | Attending: Emergency Medicine | Admitting: Emergency Medicine

## 2016-07-25 DIAGNOSIS — R110 Nausea: Secondary | ICD-10-CM

## 2016-07-25 DIAGNOSIS — R112 Nausea with vomiting, unspecified: Secondary | ICD-10-CM | POA: Insufficient documentation

## 2016-07-25 DIAGNOSIS — Z79899 Other long term (current) drug therapy: Secondary | ICD-10-CM | POA: Insufficient documentation

## 2016-07-25 DIAGNOSIS — N898 Other specified noninflammatory disorders of vagina: Secondary | ICD-10-CM | POA: Insufficient documentation

## 2016-07-25 LAB — WET PREP, GENITAL
Sperm: NONE SEEN
Trich, Wet Prep: NONE SEEN
Yeast Wet Prep HPF POC: NONE SEEN

## 2016-07-25 LAB — I-STAT CHEM 8, ED
BUN: 10 mg/dL (ref 6–20)
Calcium, Ion: 1.13 mmol/L — ABNORMAL LOW (ref 1.15–1.40)
Chloride: 102 mmol/L (ref 101–111)
Creatinine, Ser: 0.6 mg/dL (ref 0.44–1.00)
Glucose, Bld: 90 mg/dL (ref 65–99)
HCT: 38 % (ref 36.0–46.0)
Hemoglobin: 12.9 g/dL (ref 12.0–15.0)
Potassium: 3.2 mmol/L — ABNORMAL LOW (ref 3.5–5.1)
Sodium: 141 mmol/L (ref 135–145)
TCO2: 25 mmol/L (ref 0–100)

## 2016-07-25 LAB — I-STAT BETA HCG BLOOD, ED (MC, WL, AP ONLY): I-stat hCG, quantitative: <5 m[IU]/mL (ref <5)

## 2016-07-25 MED ORDER — ONDANSETRON 4 MG PO TBDP
4.0000 mg | ORAL_TABLET | Freq: Once | ORAL | Status: AC
  Administered 2016-07-25: 4 mg via ORAL
  Filled 2016-07-25: qty 1

## 2016-07-25 MED ORDER — ONDANSETRON HCL 4 MG PO TABS: 4.0000 mg | ORAL_TABLET | Freq: Four times a day (QID) | ORAL | 0 refills | Status: DC

## 2016-07-25 NOTE — ED Notes (Signed)
Pelvic cart set up at patients bedside.  

## 2016-07-25 NOTE — ED Provider Notes (Signed)
[facility]-EMERGENCY DEPT Provider Note   CSN: [id number] Arrival date & time: 07/25/16  1708  By signing my name below, I, Whitney Lang, attest that this documentation has been prepared under the direction and in the presence of non-physician practitioner, Whitney BuffaloHope Jadee Golebiewski, NP. Electronically Signed: Modena JanskyAlbert Lang, Scribe. 07/25/2016. 5:56 PM.  History   Chief Complaint Chief Complaint  Patient presents with  . Emesis    The history is provided by the patient and medical records. No language interpreter was used.  Emesis   This is a new problem. The problem has not changed since onset.Risk factors: possible pregnancy.   HPI Comments: Whitney Lang is a [age] y.o. female who presents to the Emergency Department complaining of intermittent moderate vomiting that started 2 weeks ago. Vomiting is worse after PO intake, so she hasn't ate in 3 days. She suspects a possible pregnancy due to unprotected sex during the past 2 weeks within her menstrual cycle. She tested negative on home pregancy test but states she needs a blood test to be sure. Denies vaginal bleeding or discharge.   History reviewed. No pertinent past medical history.  There are no active problems to display for this patient.   History reviewed. No pertinent surgical history.  OB History    No data available       Home Medications    Prior to Admission medications   Medication Sig Start Date End Date Taking? Authorizing Provider  ibuprofen (ADVIL,MOTRIN) 600 MG tablet Take 1 tablet (600 mg total) by mouth every 6 (six) hours as needed. Patient taking differently: Take 600 mg by mouth every 6 (six) hours as needed for moderate pain.  10/20/15   Mady GemmaElizabeth C Westfall, PA-C  naproxen sodium (ANAPROX) 220 MG tablet Take 440-660 mg by mouth 2 (two) times daily as needed (pain).    Historical Provider, MD  ondansetron (ZOFRAN ODT) 4 MG disintegrating tablet 4mg  ODT q4 hours prn nausea/vomit 11/05/15   Pricilla LovelessScott Goldston, MD  ondansetron  (ZOFRAN) 4 MG tablet Take 1 tablet (4 mg total) by mouth every 6 (six) hours. 07/25/16   Carlicia Leavens Orlene OchM Cyntia Staley, NP  predniSONE (STERAPRED UNI-PAK 21 TAB) 10 MG (21) TBPK tablet Take 1 tablet (10 mg total) by mouth daily. 05/06/16   Layla MawKristen N Ward, DO    Family History History reviewed. No pertinent family history.  Social History Social History  Substance Use Topics  . Smoking status: Never Smoker  . Smokeless tobacco: Never Used  . Alcohol use Yes     Allergies   Review of patient's allergies indicates no known allergies.   Review of Systems Review of Systems  Gastrointestinal: Positive for nausea and vomiting.  Genitourinary: Negative for vaginal bleeding and vaginal discharge.  all other systems negative   Physical Exam Updated Vital Signs BP 113/74 (BP Location: Right Arm)   Pulse (!) 56   Temp 98.5 F (36.9 C) (Oral)   Resp 15   Ht 5\' 1"  (1.549 m)   Wt 153 lb (69.4 kg)   LMP 07/18/2016   SpO2 100%   BMI 28.91 kg/m   Physical Exam  Constitutional: She appears well-developed and well-nourished. No distress.  HENT:  Head: Normocephalic.  Eyes: Conjunctivae and EOM are normal.  Neck: Normal range of motion. Neck supple.  Cardiovascular: Normal rate and regular rhythm.   Pulmonary/Chest: Effort normal and breath sounds normal.  Abdominal: Soft. Bowel sounds are normal. There is no tenderness.  Genitourinary:  Genitourinary Comments: External genitalia without lesions, white d/c vaginal  vault. No CMT, no adnexal tenderness, uterus without palpable enlargement.   Musculoskeletal: Normal range of motion.  Neurological: She is alert.  Skin: Skin is warm and dry.  Psychiatric: She has a normal mood and affect. Her behavior is normal.  Nursing note and vitals reviewed.    ED Treatments / Results  DIAGNOSTIC STUDIES: Oxygen Saturation is 100% on RA, normal by my interpretation.    COORDINATION OF CARE: 6:00 PM- Pt advised of plan for treatment and pt agrees.  7:35 PM-  Pt now reports having associated vaginal bleeding that she denied earlier.   8:01 PM- Pt is currently eating crackers and drinking sprite without any nausea.   Labs (all labs ordered are listed, but only abnormal results are displayed) Labs Reviewed  WET PREP, GENITAL - Abnormal; Notable for the following:       Result Value   Clue Cells Wet Prep HPF POC PRESENT (*)    WBC, Wet Prep HPF POC MANY (*)    All other components within normal limits  I-STAT CHEM 8, ED - Abnormal; Notable for the following:    Potassium 3.2 (*)    Calcium, Ion 1.13 (*)    All other components within normal limits  I-STAT BETA HCG BLOOD, ED ([facility], [facility], [facility] ONLY)  GC/CHLAMYDIA PROBE AMP ([place]) NOT AT [facility]   Radiology No results found.  Procedures Procedures (including critical care time)  Medications Ordered in ED Medications  ondansetron (ZOFRAN-ODT) disintegrating tablet 4 mg (4 mg Oral Given 07/25/16 1818)     Initial Impression / Assessment and Plan / ED Course  I have reviewed the triage vital signs and the nursing notes.  Pertinent labs & imaging results that were available during my care of the patient were reviewed by me and considered in my medical decision making (see chart for details).  Clinical Course    Final Clinical Impressions(s) / ED Diagnoses  25 y.o. female with nausea and pregnancy symptoms stable for d/c with negative Hcg and does not appear in any distress. Will treat for viral illness. She will f/u with her PCP or return as needed for worsening symptoms.  Final diagnoses:  Nausea    New Prescriptions Discharge Medication List as of 07/25/2016  8:02 PM    START taking these medications   Details  ondansetron (ZOFRAN) 4 MG tablet Take 1 tablet (4 mg total) by mouth every 6 (six) hours., Starting Tue 07/25/2016, Print      I personally performed the services described in this documentation, which was scribed in my presence. The recorded information has been reviewed  and is accurate.     [address] [place], [place] 07/27/16 1610    Jerelyn Scott, MD 07/28/16 [phone]

## 2016-07-25 NOTE — ED Triage Notes (Signed)
Pt states she has been vomiting for 2 weeks. Denies any other symptoms. No pain. Pt states she has had unprotected sex the past two weeks while on period. Concerned she may be pregnant. Pt took a pregnancy test that was negative.

## 2016-07-26 LAB — GC/CHLAMYDIA PROBE AMP (~~LOC~~) NOT AT ARMC
Chlamydia: NEGATIVE
Neisseria Gonorrhea: NEGATIVE

## 2017-03-28 ENCOUNTER — Encounter (HOSPITAL_COMMUNITY): Payer: Self-pay

## 2017-03-28 ENCOUNTER — Emergency Department (HOSPITAL_COMMUNITY)
Admission: EM | Admit: 2017-03-28 | Discharge: 2017-03-28 | Disposition: A | Discharge status: Home / Self Care | Payer: Self-pay | Attending: Emergency Medicine | Admitting: Emergency Medicine

## 2017-03-28 ENCOUNTER — Ambulatory Visit (HOSPITAL_COMMUNITY): Payer: Self-pay

## 2017-03-28 ENCOUNTER — Emergency Department (HOSPITAL_COMMUNITY): Payer: Self-pay

## 2017-03-28 DIAGNOSIS — N390 Urinary tract infection, site not specified: Secondary | ICD-10-CM | POA: Insufficient documentation

## 2017-03-28 LAB — URINALYSIS, ROUTINE W REFLEX MICROSCOPIC
Bacteria, UA: NONE SEEN
Bilirubin Urine: NEGATIVE
Glucose, UA: NEGATIVE mg/dL
Ketones, ur: NEGATIVE mg/dL
Nitrite: NEGATIVE
Protein, ur: 30 mg/dL — AB
Specific Gravity, Urine: 1.028 (ref 1.005–1.030)
pH: 5 (ref 5.0–8.0)

## 2017-03-28 LAB — COMPREHENSIVE METABOLIC PANEL
ALT: 14 U/L (ref 14–54)
AST: 18 U/L (ref 15–41)
Albumin: 4.1 g/dL (ref 3.5–5.0)
Alkaline Phosphatase: 71 U/L (ref 38–126)
Anion gap: 7 (ref 5–15)
BUN: 17 mg/dL (ref 6–20)
CO2: 24 mmol/L (ref 22–32)
Calcium: 8.9 mg/dL (ref 8.9–10.3)
Chloride: 108 mmol/L (ref 101–111)
Creatinine, Ser: 0.66 mg/dL (ref 0.44–1.00)
GFR calc Af Amer: >60 mL/min (ref >60)
GFR calc non Af Amer: >60 mL/min (ref >60)
Glucose, Bld: 97 mg/dL (ref 65–99)
Potassium: 4 mmol/L (ref 3.5–5.1)
Sodium: 139 mmol/L (ref 135–145)
Total Bilirubin: 0.4 mg/dL (ref 0.3–1.2)
Total Protein: 7.6 g/dL (ref 6.5–8.1)

## 2017-03-28 LAB — CBC
HCT: 37.1 % (ref 36.0–46.0)
Hemoglobin: 11.9 g/dL — ABNORMAL LOW (ref 12.0–15.0)
MCH: 26.5 pg (ref 26.0–34.0)
MCHC: 32.1 g/dL (ref 30.0–36.0)
MCV: 82.6 fL (ref 78.0–100.0)
Platelets: 371 10*3/uL (ref 150–400)
RBC: 4.49 MIL/uL (ref 3.87–5.11)
RDW: 14.8 % (ref 11.5–15.5)
WBC: 6.1 10*3/uL (ref 4.0–10.5)

## 2017-03-28 LAB — POC URINE PREG, ED: Preg Test, Ur: NEGATIVE

## 2017-03-28 LAB — I-STAT BETA HCG BLOOD, ED (MC, WL, AP ONLY): I-stat hCG, quantitative: <5 m[IU]/mL (ref <5)

## 2017-03-28 LAB — LIPASE, BLOOD: Lipase: 27 U/L (ref 11–51)

## 2017-03-28 MED ORDER — DEXTROSE 5 % IV SOLN
1.0000 g | INTRAVENOUS | Status: DC
  Administered 2017-03-28: 1 g via INTRAVENOUS
  Filled 2017-03-28: qty 10

## 2017-03-28 MED ORDER — HYDROCODONE-ACETAMINOPHEN 5-325 MG PO TABS: 2.0000 | ORAL_TABLET | ORAL | 0 refills | Status: DC | PRN

## 2017-03-28 MED ORDER — ONDANSETRON HCL 4 MG/2ML IJ SOLN
4.0000 mg | Freq: Once | INTRAMUSCULAR | Status: AC
  Administered 2017-03-28: 4 mg via INTRAVENOUS
  Filled 2017-03-28: qty 2

## 2017-03-28 MED ORDER — MORPHINE SULFATE (PF) 4 MG/ML IV SOLN
4.0000 mg | Freq: Once | INTRAVENOUS | Status: AC
  Administered 2017-03-28: 4 mg via INTRAVENOUS
  Filled 2017-03-28: qty 1

## 2017-03-28 MED ORDER — SODIUM CHLORIDE 0.9 % IV SOLN: INTRAVENOUS | Status: DC

## 2017-03-28 MED ORDER — IOPAMIDOL (ISOVUE-300) INJECTION 61%
INTRAVENOUS | Status: AC
  Administered 2017-03-28: 30 mL via ORAL
  Filled 2017-03-28: qty 30

## 2017-03-28 MED ORDER — CEPHALEXIN 500 MG PO CAPS: 500.0000 mg | ORAL_CAPSULE | Freq: Four times a day (QID) | ORAL | 0 refills | Status: DC

## 2017-03-28 MED ORDER — SODIUM CHLORIDE 0.9 % IV BOLUS (SEPSIS)
2000.0000 mL | Freq: Once | INTRAVENOUS | Status: AC
  Administered 2017-03-28: 2000 mL via INTRAVENOUS

## 2017-03-28 MED ORDER — IOPAMIDOL (ISOVUE-300) INJECTION 61%
30.0000 mL | Freq: Once | INTRAVENOUS | Status: AC | PRN
  Administered 2017-03-28: 30 mL via ORAL

## 2017-03-28 NOTE — ED Triage Notes (Signed)
Pt c/o 9/10 abd pain, n/v, and diarrhea since Sunday.

## 2017-03-28 NOTE — ED Notes (Signed)
Patient is alert and oriented x3.  She was given DC instructions and follow up visit instructions.  Patient gave verbal understanding. She was DC ambulatory under her own power to home.  V/S stable.  He was not showing any signs of distress on DC 

## 2017-03-28 NOTE — ED Provider Notes (Signed)
[facility]-EMERGENCY DEPT Provider Note   CSN: [id number] Arrival date & time: 03/28/17  0645     History   Chief Complaint Chief Complaint  Patient presents with  . Emesis  . Abdominal Pain  . Diarrhea    HPI Whitney Lang is a 26 y.o. female.  26 year old female presents with 2 days of nonbilious emesis with watery diarrhea 2 days. Denies any sick exposures. No recent travel history. No fever or chills. No cough or congestion. Pain is localized to her left lower quadrant characterized as crampy. Pain waxes and wanes and nothing seems to make it better or worse. Denies any urinary symptoms. Denies abnormal vaginal bleeding or discharge. Has used antacids without relief      History reviewed. No pertinent past medical history.  There are no active problems to display for this patient.   History reviewed. No pertinent surgical history.  OB History    No data available       Home Medications    Prior to Admission medications   Medication Sig Start Date End Date Taking? Authorizing Provider  ibuprofen (ADVIL,MOTRIN) 600 MG tablet Take 1 tablet (600 mg total) by mouth every 6 (six) hours as needed. Patient taking differently: Take 600 mg by mouth every 6 (six) hours as needed for moderate pain.  10/20/15   Mady Gemma, PA-C  naproxen sodium (ANAPROX) 220 MG tablet Take 440-660 mg by mouth 2 (two) times daily as needed (pain).    [provider]  ondansetron (ZOFRAN ODT) 4 MG disintegrating tablet 4mg  ODT q4 hours prn nausea/vomit 11/05/15   Pricilla Loveless, MD  ondansetron (ZOFRAN) 4 MG tablet Take 1 tablet (4 mg total) by mouth every 6 (six) hours. 07/25/16   Janne Napoleon, NP  predniSONE (STERAPRED UNI-PAK 21 TAB) 10 MG (21) TBPK tablet Take 1 tablet (10 mg total) by mouth daily. 05/06/16   Ward, Layla Maw, DO    Family History History reviewed. No pertinent family history.  Social History Social History  Substance Use Topics  . Smoking status: Never  Smoker  . Smokeless tobacco: Never Used  . Alcohol use Yes     Allergies   Patient has no known allergies.   Review of Systems Review of Systems  All other systems reviewed and are negative.    Physical Exam Updated Vital Signs BP (!) 97/55 (BP Location: Left Arm)   Pulse 78   Temp 98.2 F (36.8 C)   Resp 16   SpO2 99%   Physical Exam  Constitutional: She is oriented to person, place, and time. She appears well-developed and well-nourished.  Non-toxic appearance. No distress.  HENT:  Head: Normocephalic and atraumatic.  Eyes: Conjunctivae, EOM and lids are normal. Pupils are equal, round, and reactive to light.  Neck: Normal range of motion. Neck supple. No tracheal deviation present. No thyroid mass present.  Cardiovascular: Normal rate, regular rhythm and normal heart sounds.  Exam reveals no gallop.   No murmur heard. Pulmonary/Chest: Effort normal and breath sounds normal. No stridor. No respiratory distress. She has no decreased breath sounds. She has no wheezes. She has no rhonchi. She has no rales.  Abdominal: Soft. Normal appearance and bowel sounds are normal. She exhibits no distension. There is tenderness in the left lower quadrant. There is no rigidity, no rebound, no guarding and no CVA tenderness.    Musculoskeletal: Normal range of motion. She exhibits no edema or tenderness.  Neurological: She is alert and oriented to person, place,  and time. She has normal strength. No cranial nerve deficit or sensory deficit. GCS eye subscore is 4. GCS verbal subscore is 5. GCS motor subscore is 6.  Skin: Skin is warm and dry. No abrasion and no rash noted.  Psychiatric: She has a normal mood and affect. Her speech is normal and behavior is normal.  Nursing note and vitals reviewed.    ED Treatments / Results  Labs (all labs ordered are listed, but only abnormal results are displayed) Labs Reviewed  CBC - Abnormal; Notable for the following:       Result Value    Hemoglobin 11.9 (*)    All other components within normal limits  LIPASE, BLOOD  COMPREHENSIVE METABOLIC PANEL  URINALYSIS, ROUTINE W REFLEX MICROSCOPIC  POC URINE PREG, ED  I-STAT BETA HCG BLOOD, ED ([facility], [facility], [facility] ONLY)    EKG  EKG Interpretation None       Radiology No results found.  Procedures Procedures (including critical care time)  Medications Ordered in ED Medications  0.9 %  sodium chloride infusion (not administered)  sodium chloride 0.9 % bolus 2,000 mL (not administered)  morphine 4 MG/ML injection 4 mg (not administered)  ondansetron (ZOFRAN) injection 4 mg (not administered)     Initial Impression / Assessment and Plan / ED Course  I have reviewed the triage vital signs and the nursing notes.  Pertinent labs & imaging results that were available during my care of the patient were reviewed by me and considered in my medical decision making (see chart for details).     Pt medicated for pain and uti tx w/ abx abd ct ordered due to patients severe llq pain Pt now Wants to defer having the abdominal CT. Risk of serious intra-abdominal process was explained to her and she accepts this. Will place her antibiotics as well as given analgesics and patient will return changes her mind or if her symptoms worsen.  Final Clinical Impressions(s) / ED Diagnoses   Final diagnoses:  None    New Prescriptions New Prescriptions   No medications on file     Lorre NickAllen, [place] Gandolfo, MD 03/28/17 [id number]

## 2017-03-28 NOTE — Discharge Instructions (Signed)
You were offered a CAT scan of your abdomen to evaluate the cause your pain and you have deferred at this time. You understands that you could have a serious condition that could be life-threatening and you accept this risk. Please take the antibiotics and return here if the pain becomes worse or if you change your mind

## 2017-05-31 ENCOUNTER — Emergency Department (HOSPITAL_COMMUNITY): Payer: No Typology Code available for payment source

## 2017-05-31 ENCOUNTER — Emergency Department (HOSPITAL_COMMUNITY)
Admission: EM | Admit: 2017-05-31 | Discharge: 2017-05-31 | Disposition: A | Discharge status: Home / Self Care | Payer: No Typology Code available for payment source | Attending: Emergency Medicine | Admitting: Emergency Medicine

## 2017-05-31 ENCOUNTER — Encounter (HOSPITAL_COMMUNITY): Payer: Self-pay | Admitting: Nurse Practitioner

## 2017-05-31 DIAGNOSIS — Y999 Unspecified external cause status: Secondary | ICD-10-CM | POA: Insufficient documentation

## 2017-05-31 DIAGNOSIS — Y939 Activity, unspecified: Secondary | ICD-10-CM | POA: Diagnosis not present

## 2017-05-31 DIAGNOSIS — R6884 Jaw pain: Secondary | ICD-10-CM | POA: Diagnosis not present

## 2017-05-31 DIAGNOSIS — Y92481 Parking lot as the place of occurrence of the external cause: Secondary | ICD-10-CM | POA: Insufficient documentation

## 2017-05-31 DIAGNOSIS — R0781 Pleurodynia: Secondary | ICD-10-CM | POA: Insufficient documentation

## 2017-05-31 MED ORDER — IBUPROFEN 600 MG PO TABS: 600.0000 mg | ORAL_TABLET | Freq: Four times a day (QID) | ORAL | 0 refills | Status: DC | PRN

## 2017-05-31 MED ORDER — IBUPROFEN 400 MG PO TABS
600.0000 mg | ORAL_TABLET | Freq: Once | ORAL | Status: AC
  Administered 2017-05-31: 600 mg via ORAL
  Filled 2017-05-31: qty 1
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
## 2017-05-31 NOTE — ED Notes (Signed)
ED Provider at bedside. 

## 2017-05-31 NOTE — ED Notes (Signed)
Patient transported to CT 
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
## 2017-05-31 NOTE — Discharge Instructions (Signed)
Please read attached information. If you experience any new or worsening signs or symptoms please return to the emergency room for evaluation. Please follow-up with your primary care provider or specialist as discussed. Please use medication prescribed only as directed and discontinue taking if you have any concerning signs or symptoms.   °

## 2017-06-18 ENCOUNTER — Encounter (HOSPITAL_COMMUNITY): Payer: Self-pay | Admitting: Emergency Medicine

## 2017-06-18 ENCOUNTER — Emergency Department (HOSPITAL_COMMUNITY): Payer: Self-pay

## 2017-06-18 ENCOUNTER — Emergency Department (HOSPITAL_COMMUNITY)
Admission: EM | Admit: 2017-06-18 | Discharge: 2017-06-18 | Disposition: A | Discharge status: Home / Self Care | Payer: Self-pay | Attending: Emergency Medicine | Admitting: Emergency Medicine

## 2017-06-18 DIAGNOSIS — R509 Fever, unspecified: Secondary | ICD-10-CM

## 2017-06-18 DIAGNOSIS — R1032 Left lower quadrant pain: Secondary | ICD-10-CM

## 2017-06-18 DIAGNOSIS — Z79899 Other long term (current) drug therapy: Secondary | ICD-10-CM | POA: Insufficient documentation

## 2017-06-18 LAB — RAPID STREP SCREEN (MED CTR MEBANE ONLY): Streptococcus, Group A Screen (Direct): NEGATIVE

## 2017-06-18 LAB — URINALYSIS, ROUTINE W REFLEX MICROSCOPIC
Bilirubin Urine: NEGATIVE
Glucose, UA: NEGATIVE mg/dL
Ketones, ur: 80 mg/dL — AB
Nitrite: NEGATIVE
Protein, ur: 100 mg/dL — AB
Specific Gravity, Urine: 1.028 (ref 1.005–1.030)
pH: 8 (ref 5.0–8.0)

## 2017-06-18 LAB — COMPREHENSIVE METABOLIC PANEL
ALT: 14 U/L (ref 14–54)
AST: 20 U/L (ref 15–41)
Albumin: 4.2 g/dL (ref 3.5–5.0)
Alkaline Phosphatase: 74 U/L (ref 38–126)
Anion gap: 10 (ref 5–15)
BUN: 10 mg/dL (ref 6–20)
CO2: 24 mmol/L (ref 22–32)
Calcium: 9 mg/dL (ref 8.9–10.3)
Chloride: 104 mmol/L (ref 101–111)
Creatinine, Ser: 0.66 mg/dL (ref 0.44–1.00)
GFR calc Af Amer: >60 mL/min (ref >60)
GFR calc non Af Amer: >60 mL/min (ref >60)
Glucose, Bld: 87 mg/dL (ref 65–99)
Potassium: 3.4 mmol/L — ABNORMAL LOW (ref 3.5–5.1)
Sodium: 138 mmol/L (ref 135–145)
Total Bilirubin: 0.6 mg/dL (ref 0.3–1.2)
Total Protein: 8.3 g/dL — ABNORMAL HIGH (ref 6.5–8.1)

## 2017-06-18 LAB — CBC WITH DIFFERENTIAL/PLATELET
Basophils Absolute: 0 10*3/uL (ref 0.0–0.1)
Basophils Relative: 0 %
Eosinophils Absolute: 0 10*3/uL (ref 0.0–0.7)
Eosinophils Relative: 0 %
HCT: 37.7 % (ref 36.0–46.0)
Hemoglobin: 12.5 g/dL (ref 12.0–15.0)
Lymphocytes Relative: 11 %
Lymphs Abs: 1.1 10*3/uL (ref 0.7–4.0)
MCH: 27.6 pg (ref 26.0–34.0)
MCHC: 33.2 g/dL (ref 30.0–36.0)
MCV: 83.2 fL (ref 78.0–100.0)
Monocytes Absolute: 0.5 10*3/uL (ref 0.1–1.0)
Monocytes Relative: 5 %
Neutro Abs: 8 10*3/uL — ABNORMAL HIGH (ref 1.7–7.7)
Neutrophils Relative %: 84 %
Platelets: 342 10*3/uL (ref 150–400)
RBC: 4.53 MIL/uL (ref 3.87–5.11)
RDW: 13.5 % (ref 11.5–15.5)
WBC: 9.5 10*3/uL (ref 4.0–10.5)

## 2017-06-18 LAB — I-STAT BETA HCG BLOOD, ED (MC, WL, AP ONLY): I-stat hCG, quantitative: 10.1 m[IU]/mL — ABNORMAL HIGH (ref <5)

## 2017-06-18 LAB — HCG, QUANTITATIVE, PREGNANCY: hCG, Beta Chain, Quant, S: <1 m[IU]/mL (ref <5)

## 2017-06-18 LAB — I-STAT CG4 LACTIC ACID, ED: Lactic Acid, Venous: 1.26 mmol/L (ref 0.5–1.9)

## 2017-06-18 MED ORDER — IOPAMIDOL (ISOVUE-300) INJECTION 61%
INTRAVENOUS | Status: AC
  Filled 2017-06-18: qty 30

## 2017-06-18 MED ORDER — ACETAMINOPHEN 325 MG PO TABS
650.0000 mg | ORAL_TABLET | Freq: Once | ORAL | Status: AC | PRN
  Administered 2017-06-18: 650 mg via ORAL
  Filled 2017-06-18: qty 2

## 2017-06-18 MED ORDER — CEPHALEXIN 500 MG PO CAPS: 500.0000 mg | ORAL_CAPSULE | Freq: Three times a day (TID) | ORAL | 0 refills | Status: DC

## 2017-06-18 MED ORDER — DOXYCYCLINE HYCLATE 100 MG PO CAPS: 100.0000 mg | ORAL_CAPSULE | Freq: Two times a day (BID) | ORAL | 0 refills | Status: DC

## 2017-06-18 MED ORDER — SODIUM CHLORIDE 0.9 % IV BOLUS (SEPSIS)
1000.0000 mL | Freq: Once | INTRAVENOUS | Status: AC
  Administered 2017-06-18: 1000 mL via INTRAVENOUS

## 2017-06-18 MED ORDER — HYDROXYZINE HCL 25 MG PO TABS: 25.0000 mg | ORAL_TABLET | Freq: Three times a day (TID) | ORAL | 0 refills | Status: DC | PRN

## 2017-06-18 MED ORDER — MORPHINE SULFATE (PF) 4 MG/ML IV SOLN: 4.0000 mg | Freq: Once | INTRAVENOUS | Status: DC

## 2017-06-18 MED ORDER — IOPAMIDOL (ISOVUE-300) INJECTION 61%
30.0000 mL | Freq: Once | INTRAVENOUS | Status: AC | PRN
  Administered 2017-06-18: 30 mL via ORAL

## 2017-06-18 MED ORDER — NAPROXEN 375 MG PO TABS: 375.0000 mg | ORAL_TABLET | Freq: Two times a day (BID) | ORAL | 0 refills | Status: AC | PRN

## 2017-06-18 MED ORDER — DOXYCYCLINE HYCLATE 100 MG PO CAPS: 100.0000 mg | ORAL_CAPSULE | Freq: Two times a day (BID) | ORAL | 0 refills | Status: AC

## 2017-06-18 MED ORDER — ONDANSETRON HCL 4 MG PO TABS: 4.0000 mg | ORAL_TABLET | Freq: Four times a day (QID) | ORAL | 0 refills | Status: DC

## 2017-06-18 MED ORDER — IOPAMIDOL (ISOVUE-370) INJECTION 76%: 100.0000 mL | Freq: Once | INTRAVENOUS | Status: DC | PRN

## 2017-06-18 MED ORDER — HYDROXYZINE HCL 25 MG PO TABS: 25.0000 mg | ORAL_TABLET | Freq: Three times a day (TID) | ORAL | 0 refills | Status: AC | PRN

## 2017-06-18 MED ORDER — PROMETHAZINE HCL 25 MG PO TABS: 25.0000 mg | ORAL_TABLET | Freq: Three times a day (TID) | ORAL | 0 refills | Status: DC | PRN

## 2017-06-18 MED ORDER — IOPAMIDOL (ISOVUE-300) INJECTION 61%
75.0000 mL | Freq: Once | INTRAVENOUS | Status: AC | PRN
  Administered 2017-06-18: 75 mL via INTRAVENOUS

## 2017-06-18 MED ORDER — METRONIDAZOLE 500 MG PO TABS
2000.0000 mg | ORAL_TABLET | Freq: Once | ORAL | Status: AC
  Administered 2017-06-18: 2000 mg via ORAL
  Filled 2017-06-18: qty 4

## 2017-06-18 MED ORDER — DEXTROSE 5 % IV SOLN
2.0000 g | Freq: Once | INTRAVENOUS | Status: AC
  Administered 2017-06-18: 2 g via INTRAVENOUS
  Filled 2017-06-18: qty 2

## 2017-06-18 MED ORDER — METOCLOPRAMIDE HCL 5 MG/ML IJ SOLN
10.0000 mg | Freq: Once | INTRAMUSCULAR | Status: AC
  Administered 2017-06-18: 10 mg via INTRAVENOUS
  Filled 2017-06-18: qty 2

## 2017-06-18 MED ORDER — ONDANSETRON HCL 4 MG/2ML IJ SOLN
4.0000 mg | Freq: Once | INTRAMUSCULAR | Status: AC
  Administered 2017-06-18: 4 mg via INTRAVENOUS
  Filled 2017-06-18: qty 2

## 2017-06-18 MED ORDER — IOPAMIDOL (ISOVUE-300) INJECTION 61%
INTRAVENOUS | Status: AC
  Administered 2017-06-18: 30 mL via ORAL
  Filled 2017-06-18: qty 75

## 2017-06-18 MED ORDER — CEPHALEXIN 500 MG PO CAPS: 500.0000 mg | ORAL_CAPSULE | Freq: Three times a day (TID) | ORAL | 0 refills | Status: AC

## 2017-06-18 MED ORDER — KETOROLAC TROMETHAMINE 15 MG/ML IJ SOLN
15.0000 mg | Freq: Once | INTRAMUSCULAR | Status: AC
  Administered 2017-06-18: 15 mg via INTRAVENOUS
  Filled 2017-06-18: qty 1

## 2017-06-18 MED ORDER — DIPHENHYDRAMINE HCL 50 MG/ML IJ SOLN
25.0000 mg | Freq: Once | INTRAMUSCULAR | Status: AC
  Administered 2017-06-18: 25 mg via INTRAVENOUS
  Filled 2017-06-18: qty 1

## 2017-06-18 MED ORDER — NAPROXEN 375 MG PO TABS: 375.0000 mg | ORAL_TABLET | Freq: Two times a day (BID) | ORAL | 0 refills | Status: DC | PRN

## 2017-06-18 NOTE — ED Notes (Signed)
RN attempted for several minutes to obtain rapid strep screen. Pt unable to relax to obtain throat culture. Pt bites swab, swats swab away, moves head away from swab due to anxiety about swabbing posterior throat. RN unable to obtain culture.

## 2017-06-18 NOTE — ED Triage Notes (Signed)
Per EMS, pt c/o intermittent LLQ abdominal pain x 1 month, nausea and emesis onset 4 days ago after initiating ciprofloxacin for trichomoniases, fever and chills onset 2 days ago. Given ODT Zofran today at PCP without relief.

## 2017-06-18 NOTE — ED Provider Notes (Signed)
[facility]-EMERGENCY DEPT Provider Note   CSN: [id number] Arrival date & time: 06/18/17  1253     History   Chief Complaint Chief Complaint  Patient presents with  . Abdominal Pain    HPI Whitney Lang is a 26 y.o. female.  HPI 26 year old female with past medical history of recurrent abdominal pain here with persistent left lower quadrant abdominal pain. Patient's history is somewhat complicated but she states that she was recently seen and treated for possible STD. She has been on Cipro and Flagyl, which sounds more like treatment for diverticulitis. She states that over the last several weeks, she has had progressive, intermittent nausea as well as left lower quadrant abdominal pain. She's had associated vomiting. She has had persistent vomiting since she started Cipro and Flagyl and has been able to eat or drink. She has associated moderate urinary frequency and urgency. Denies any overt flank pain. Denies any blood in her stool or emesis. Denies any recent high-risk sexual activity and states she has not had intercourse since her last STD treatment. Denies any vaginal bleeding beyond her menstrual period, which started today.  History reviewed. No pertinent past medical history.  There are no active problems to display for this patient.   History reviewed. No pertinent surgical history.  OB History    No data available       Home Medications    Prior to Admission medications   Medication Sig Start Date End Date Taking? Authorizing Provider  ciprofloxacin (CIPRO) 500 MG tablet Take 500 mg by mouth 2 (two) times daily.   Yes [provider]  metroNIDAZOLE (FLAGYL) 500 MG tablet Take 500 mg by mouth 2 (two) times daily.   Yes [provider]  cephALEXin (KEFLEX) 500 MG capsule Take 1 capsule (500 mg total) by mouth 3 (three) times daily. 06/18/17 06/28/17  Shaune Pollack, MD  doxycycline (VIBRAMYCIN) 100 MG capsule Take 1 capsule (100 mg total) by mouth 2 (two) times  daily. 06/18/17 07/02/17  Shaune Pollack, MD  HYDROcodone-acetaminophen (NORCO/VICODIN) 5-325 MG tablet Take 2 tablets by mouth every 4 (four) hours as needed. Patient not taking: Reported on 06/18/2017 03/28/17   Lorre Nick, MD  hydrOXYzine (ATARAX/VISTARIL) 25 MG tablet Take 1 tablet (25 mg total) by mouth every 8 (eight) hours as needed for anxiety. 06/18/17   Shaune Pollack, MD  ibuprofen (ADVIL,MOTRIN) 600 MG tablet Take 1 tablet (600 mg total) by mouth every 6 (six) hours as needed. Patient not taking: Reported on 06/18/2017 05/31/17   Hedges, Tinnie Gens, PA-C  naproxen (NAPROSYN) 375 MG tablet Take 1 tablet (375 mg total) by mouth 2 (two) times daily as needed for moderate pain. 06/18/17 06/25/17  Shaune Pollack, MD  predniSONE (STERAPRED UNI-PAK 21 TAB) 10 MG (21) TBPK tablet Take 1 tablet (10 mg total) by mouth daily. Patient not taking: Reported on 03/28/2017 05/06/16   Ward, Layla Maw, DO  promethazine (PHENERGAN) 25 MG tablet Take 1 tablet (25 mg total) by mouth every 8 (eight) hours as needed for nausea or vomiting. 06/18/17   Shaune Pollack, MD    Family History History reviewed. No pertinent family history.  Social History Social History  Substance Use Topics  . Smoking status: Never Smoker  . Smokeless tobacco: Never Used  . Alcohol use No     Allergies   Patient has no known allergies.   Review of Systems Review of Systems  Constitutional: Positive for chills and fever. Negative for fatigue.  HENT: Negative for congestion and rhinorrhea.  Eyes: Negative for visual disturbance.  Respiratory: Negative for cough, shortness of breath and wheezing.   Cardiovascular: Negative for chest pain and leg swelling.  Gastrointestinal: Positive for abdominal pain, nausea and vomiting. Negative for diarrhea.  Genitourinary: Positive for dysuria and frequency. Negative for flank pain.  Musculoskeletal: Negative for neck pain and neck stiffness.  Skin: Negative for rash and wound.    Allergic/Immunologic: Negative for immunocompromised state.  Neurological: Negative for syncope, weakness and headaches.  All other systems reviewed and are negative.    Physical Exam Updated Vital Signs BP 107/66   Pulse 81   Temp 98.6 F (37 C) (Oral)   Resp 18   Ht 5\' 2"  (1.575 m)   Wt 54.4 kg (120 lb)   LMP 06/07/2017   SpO2 100%   BMI 21.95 kg/m   Physical Exam  Constitutional: She is oriented to person, place, and time. She appears well-developed and well-nourished. No distress.  HENT:  Head: Normocephalic and atraumatic.  Mildly dry MM  Eyes: Conjunctivae are normal.  Neck: Neck supple.  Cardiovascular: Normal rate, regular rhythm and normal heart sounds.  Exam reveals no friction rub.   No murmur heard. Pulmonary/Chest: Effort normal and breath sounds normal. No respiratory distress. She has no wheezes. She has no rales.  Abdominal: Soft. Normal appearance. She exhibits no distension. There is tenderness in the suprapubic area. There is no rigidity, no rebound and no guarding.  No CVAT. No adnexal TTP.  Musculoskeletal: She exhibits no edema.  Neurological: She is alert and oriented to person, place, and time. She exhibits normal muscle tone.  Skin: Skin is warm. Capillary refill takes less than 2 seconds.  Psychiatric: She has a normal mood and affect.  Nursing note and vitals reviewed.    ED Treatments / Results  Labs (all labs ordered are listed, but only abnormal results are displayed) Labs Reviewed  COMPREHENSIVE METABOLIC PANEL - Abnormal; Notable for the following:       Result Value   Potassium 3.4 (*)    Total Protein 8.3 (*)    All other components within normal limits  CBC WITH DIFFERENTIAL/PLATELET - Abnormal; Notable for the following:    Neutro Abs 8.0 (*)    All other components within normal limits  URINALYSIS, ROUTINE W REFLEX MICROSCOPIC - Abnormal; Notable for the following:    APPearance HAZY (*)    Hgb urine dipstick MODERATE (*)     Ketones, ur 80 (*)    Protein, ur 100 (*)    Leukocytes, UA TRACE (*)    Bacteria, UA RARE (*)    Squamous Epithelial / LPF 0-5 (*)    All other components within normal limits  I-STAT BETA HCG BLOOD, ED ([facility], [facility], [facility] ONLY) - Abnormal; Notable for the following:    I-stat hCG, quantitative 10.1 (*)    All other components within normal limits  RAPID STREP SCREEN (NOT AT [facility])  CULTURE, GROUP A STREP ([facility])  URINE CULTURE  HCG, QUANTITATIVE, PREGNANCY  I-STAT CG4 LACTIC ACID, ED    EKG  EKG Interpretation None       Radiology Ct Abdomen Pelvis W Contrast  Result Date: 06/18/2017 CLINICAL DATA:  Chronic left lower quadrant abdominal pain, nausea and vomiting. Initial encounter. EXAM: CT ABDOMEN AND PELVIS WITH CONTRAST TECHNIQUE: Multidetector CT imaging of the abdomen and pelvis was performed using the standard protocol following bolus administration of intravenous contrast. CONTRAST:  75mL ISOVUE-300 IOPAMIDOL (ISOVUE-300) INJECTION 61% COMPARISON:  CT of the  abdomen and pelvis from 01/05/2009 FINDINGS: Lower chest: The visualized lung bases are grossly clear. The visualized portions of the mediastinum are unremarkable. Hepatobiliary: The liver is unremarkable in appearance. The gallbladder is unremarkable in appearance. The common bile duct remains normal in caliber. Pancreas: The pancreas is within normal limits. Spleen: The spleen is unremarkable in appearance. Adrenals/Urinary Tract: The adrenal glands are unremarkable in appearance. The kidneys are within normal limits. There is no evidence of hydronephrosis. No renal or ureteral stones are identified. No perinephric stranding is seen. Stomach/Bowel: The stomach is unremarkable in appearance. The small bowel is within normal limits. The appendix is normal in caliber, without evidence of appendicitis. The colon is unremarkable in appearance. Vascular/Lymphatic: The abdominal aorta is unremarkable in appearance. The inferior vena cava is  grossly unremarkable. No retroperitoneal lymphadenopathy is seen. No pelvic sidewall lymphadenopathy is identified. Reproductive: The bladder is mildly distended and grossly unremarkable. The uterus is grossly unremarkable in appearance. The ovaries are relatively symmetric. No suspicious adnexal masses are seen. Other: No additional soft tissue abnormalities are seen. Musculoskeletal: No acute osseous abnormalities are identified. The visualized musculature is unremarkable in appearance. IMPRESSION: Unremarkable contrast-enhanced CT of the abdomen and pelvis. Electronically Signed   By: Roanna RaiderJeffery  Chang M.D.   On: 06/18/2017 21:14    Procedures Procedures (including critical care time)  Medications Ordered in ED Medications  acetaminophen (TYLENOL) tablet 650 mg (650 mg Oral Given 06/18/17 1324)  metoCLOPramide (REGLAN) injection 10 mg (10 mg Intravenous Given 06/18/17 1820)  diphenhydrAMINE (BENADRYL) injection 25 mg (25 mg Intravenous Given 06/18/17 1821)  sodium chloride 0.9 % bolus 1,000 mL (0 mLs Intravenous Stopped 06/18/17 1939)  iopamidol (ISOVUE-300) 61 % injection 30 mL (30 mLs Oral Contrast Given 06/18/17 1615)  ketorolac (TORADOL) 15 MG/ML injection 15 mg (15 mg Intravenous Given 06/18/17 1903)  sodium chloride 0.9 % bolus 1,000 mL (0 mLs Intravenous Stopped 06/18/17 2142)  cefTRIAXone (ROCEPHIN) 2 g in dextrose 5 % 50 mL IVPB (0 g Intravenous Stopped 06/18/17 2131)  iopamidol (ISOVUE-300) 61 % injection 75 mL (75 mLs Intravenous Contrast Given 06/18/17 2105)  ondansetron (ZOFRAN) injection 4 mg (4 mg Intravenous Given 06/18/17 2140)  metroNIDAZOLE (FLAGYL) tablet 2,000 mg (2,000 mg Oral Given 06/18/17 2139)     Initial Impression / Assessment and Plan / ED Course  I have reviewed the triage vital signs and the nursing notes.  Pertinent labs & imaging results that were available during my care of the patient were reviewed by me and considered in my medical decision making (see chart for details).       26 yo F here with lower abd pain x several weeks, on cipro/flagyl but now with difficulty tolerating meds 2/2 nausea. On arrival, pt febrile but not hypotensive. She is non-toxic on exam. Lab work shows positive UTI, is o/w very reassuring with normal WBC, normal renal function and electrolytes. CT scan obtained 2/2 her pain and shows no acute abnormality. Suspect UTI, possibly with ongoing PID given that she did not fill her meds. No signs of TOA on CT of the pelvis. Will tx with Keflex for UTI, doxy, and given antiemetics. Pt is o/w well appearing after fluids, tolerating PO. Work note provided. No signs of appendicitis. Pain is not c/w torsion. Initial hcg equivocal so formal hcg quant sent and is neg - do not suspect pregnancy or ectopic.  Final Clinical Impressions(s) / ED Diagnoses   Final diagnoses:  LLQ pain  Fever in adult  New Prescriptions Discharge Medication List as of 06/18/2017 10:29 PM       Shaune Pollack, MD 06/19/17 1108

## 2017-06-18 NOTE — ED Notes (Signed)
Attempt to call PT for lab PT was not in waiting area

## 2017-06-18 NOTE — ED Notes (Signed)
Patient transported to CT 

## 2017-06-18 NOTE — ED Notes (Signed)
ED Provider at bedside. 

## 2017-06-20 LAB — URINE CULTURE

## 2017-06-21 LAB — CULTURE, GROUP A STREP (THRC)

## 2017-11-19 ENCOUNTER — Encounter (HOSPITAL_COMMUNITY): Payer: Self-pay | Admitting: *Deleted

## 2017-11-19 ENCOUNTER — Other Ambulatory Visit: Payer: Self-pay

## 2017-11-19 ENCOUNTER — Inpatient Hospital Stay (HOSPITAL_COMMUNITY)
Admission: AD | Admit: 2017-11-19 | Discharge: 2017-11-19 | Disposition: A | Discharge status: Home / Self Care | Payer: BLUE CROSS/BLUE SHIELD | Source: Ambulatory Visit | Attending: Obstetrics and Gynecology | Admitting: Obstetrics and Gynecology

## 2017-11-19 DIAGNOSIS — R112 Nausea with vomiting, unspecified: Secondary | ICD-10-CM | POA: Insufficient documentation

## 2017-11-19 HISTORY — DX: Anxiety disorder, unspecified: F41.9

## 2017-11-19 HISTORY — DX: Other specified health status: Z78.9

## 2017-11-19 LAB — URINALYSIS, ROUTINE W REFLEX MICROSCOPIC
Bilirubin Urine: NEGATIVE
Glucose, UA: NEGATIVE mg/dL
Hgb urine dipstick: NEGATIVE
Ketones, ur: NEGATIVE mg/dL
Nitrite: NEGATIVE
Protein, ur: NEGATIVE mg/dL
Specific Gravity, Urine: 1.023 (ref 1.005–1.030)
pH: 7 (ref 5.0–8.0)

## 2017-11-19 LAB — POCT PREGNANCY, URINE: Preg Test, Ur: NEGATIVE

## 2017-11-19 MED ORDER — ONDANSETRON 4 MG PO TBDP
4.0000 mg | ORAL_TABLET | Freq: Once | ORAL | Status: AC
  Administered 2017-11-19: 4 mg via ORAL
  Filled 2017-11-19: qty 1

## 2017-11-19 MED ORDER — ONDANSETRON 4 MG PO TBDP: 4.0000 mg | ORAL_TABLET | Freq: Four times a day (QID) | ORAL | 0 refills | Status: DC | PRN

## 2017-11-19 NOTE — MAU Note (Signed)
Been throwing up since this morning. Been eating a lot, like more than she should (like 4 meals in 2 hrs).  Then she felt sick.Has not done a home test.  Is gaining weight

## 2017-11-19 NOTE — Discharge Instructions (Signed)

## 2017-11-19 NOTE — MAU Provider Note (Signed)
Chief Complaint:  Nausea   First Provider Initiated Contact with Patient 11/19/17 [phone]      HPI: Whitney Lang is a 27 y.o. [id number] who presents to maternity admissions reporting nausea and vomiting today.  Lately has been eating more than usual.  Is concerned she might be pregnant.  . She reports vaginal bleeding, vaginal itching/burning, urinary symptoms, h/a, dizziness or fever/chills.    Emesis   This is a new problem. The current episode started today. The problem occurs less than 2 times per day. The problem has been resolved. There has been no fever. Pertinent negatives include no abdominal pain, chest pain, chills, coughing, diarrhea, dizziness, fever, myalgias or URI. She has tried nothing for the symptoms.    RN Note: Been throwing up since this morning. Been eating a lot, like more than she should (like 4 meals in 2 hrs).  Then she felt sick.Has not done a home test.  Is gaining weight    Past Medical History: Past Medical History:  Diagnosis Date  . Anxiety    not currently on meds  . Medical history non-contributory     Past obstetric history: OB History  Gravida Para Term Preterm AB Living  0 0 0 0 0 0  SAB TAB Ectopic Multiple Live Births  0 0 0 0 0        Past Surgical History: Past Surgical History:  Procedure Laterality Date  . NO PAST SURGERIES      Family History: Family History  Problem Relation Age of Onset  . Hypertension Father     Social History: Social History   Tobacco Use  . Smoking status: Never Smoker  . Smokeless tobacco: Never Used  Substance Use Topics  . Alcohol use: No  . Drug use: No    Allergies: No Known Allergies  Meds:  Medications Prior to Admission  Medication Sig Dispense Refill Last Dose  . ciprofloxacin (CIPRO) 500 MG tablet Take 500 mg by mouth 2 (two) times daily.   06/18/2017 at Unknown time  . HYDROcodone-acetaminophen (NORCO/VICODIN) 5-325 MG tablet Take 2 tablets by mouth every 4 (four) hours as needed.  (Patient not taking: Reported on 06/18/2017) 10 tablet 0 Not Taking at Unknown time  . hydrOXYzine (ATARAX/VISTARIL) 25 MG tablet Take 1 tablet (25 mg total) by mouth every 8 (eight) hours as needed for anxiety. 20 tablet 0   . ibuprofen (ADVIL,MOTRIN) 600 MG tablet Take 1 tablet (600 mg total) by mouth every 6 (six) hours as needed. (Patient not taking: Reported on 06/18/2017) 30 tablet 0 Not Taking at Unknown time  . metroNIDAZOLE (FLAGYL) 500 MG tablet Take 500 mg by mouth 2 (two) times daily.   06/18/2017 at Unknown time  . predniSONE (STERAPRED UNI-PAK 21 TAB) 10 MG (21) TBPK tablet Take 1 tablet (10 mg total) by mouth daily. (Patient not taking: Reported on 03/28/2017) 21 tablet 0 Not Taking at Unknown time  . promethazine (PHENERGAN) 25 MG tablet Take 1 tablet (25 mg total) by mouth every 8 (eight) hours as needed for nausea or vomiting. 20 tablet 0     I have reviewed patient's Past Medical Hx, Surgical Hx, Family Hx, Social Hx, medications and allergies.  ROS:  Review of Systems  Constitutional: Negative for chills and fever.  Respiratory: Negative for cough.   Cardiovascular: Negative for chest pain.  Gastrointestinal: Positive for vomiting. Negative for abdominal pain and diarrhea.  Musculoskeletal: Negative for myalgias.  Neurological: Negative for dizziness.   Other systems negative  Physical Exam   Patient Vitals for the past 24 hrs:  BP Temp Temp src Pulse Resp SpO2 Weight  11/19/17 0834 109/64 98.2 F (36.8 C) Oral 68 16 100 % 143 lb (64.9 kg)   Constitutional: Well-developed, well-nourished female in no acute distress.  Cardiovascular: normal rate and rhythm, no ectopy audible, S1 & S2 heard, no murmur Respiratory: normal effort, no distress. Lungs CTAB with no wheezes or crackles GI: Abd soft, non-tender.  Nondistended.  No rebound, No guarding.  Bowel Sounds audible  MS: Extremities nontender, no edema, normal ROM Neurologic: Alert and oriented x 4.   Grossly  nonfocal. GU: Neg CVAT. Skin:  Warm and Dry Psych:  Affect appropriate.  PELVIC EXAM: deferred,not indicated   Labs: Results for orders placed or performed during the hospital encounter of 11/19/17 (from the past 24 hour(s))  Urinalysis, Routine w reflex microscopic     Status: Abnormal   Collection Time: 11/19/17  8:42 AM  Result Value Ref Range   Color, Urine YELLOW YELLOW   APPearance HAZY (A) CLEAR   Specific Gravity, Urine 1.023 1.005 - 1.030   pH 7.0 5.0 - 8.0   Glucose, UA NEGATIVE NEGATIVE mg/dL   Hgb urine dipstick NEGATIVE NEGATIVE   Bilirubin Urine NEGATIVE NEGATIVE   Ketones, ur NEGATIVE NEGATIVE mg/dL   Protein, ur NEGATIVE NEGATIVE mg/dL   Nitrite NEGATIVE NEGATIVE   Leukocytes, UA TRACE (A) NEGATIVE   RBC / HPF 0-5 0 - 5 RBC/hpf   WBC, UA 0-5 0 - 5 WBC/hpf   Bacteria, UA RARE (A) NONE SEEN   Squamous Epithelial / LPF 6-30 (A) NONE SEEN   Mucus PRESENT   Pregnancy, urine POC     Status: None   Collection Time: 11/19/17  8:49 AM  Result Value Ref Range   Preg Test, Ur NEGATIVE NEGATIVE    Imaging:  No results found.  MAU Course/MDM: I have ordered labs as follows: UA, UPT.  No evidence of dehydration, not pregnant Imaging ordered: none Results reviewed. Not pregnant. No dry. . No vomiting while here.     Treatments in MAU included Zofran which helped nausea   Pt stable at time of discharge.  Assessment: Nausea and vomiting No dehydration Not pregnant  Plan: Discharge home Recommend advance diet as tolerated Rx sent for zofran for nausea   Encouraged to return here or to other Urgent Care/ED if she develops worsening of symptoms, increase in pain, fever, or other concerning symptoms.   Wynelle BourgeoisMarie Ardian Haberland CNM, MSN Certified Nurse-Midwife 11/19/2017 8:53 AM

## 2017-12-07 ENCOUNTER — Encounter (HOSPITAL_COMMUNITY): Payer: Self-pay | Admitting: *Deleted

## 2017-12-07 ENCOUNTER — Emergency Department (HOSPITAL_BASED_OUTPATIENT_CLINIC_OR_DEPARTMENT_OTHER)
Admission: EM | Admit: 2017-12-07 | Discharge: 2017-12-08 | Disposition: A | Discharge status: Home / Self Care | Payer: BLUE CROSS/BLUE SHIELD | Attending: Emergency Medicine | Admitting: Emergency Medicine

## 2017-12-07 ENCOUNTER — Inpatient Hospital Stay (HOSPITAL_COMMUNITY)
Admission: AD | Admit: 2017-12-07 | Discharge: 2017-12-07 | Payer: BLUE CROSS/BLUE SHIELD | Source: Ambulatory Visit | Attending: Obstetrics and Gynecology | Admitting: Obstetrics and Gynecology

## 2017-12-07 DIAGNOSIS — J111 Influenza due to unidentified influenza virus with other respiratory manifestations: Secondary | ICD-10-CM | POA: Insufficient documentation

## 2017-12-07 DIAGNOSIS — R112 Nausea with vomiting, unspecified: Secondary | ICD-10-CM | POA: Diagnosis present

## 2017-12-07 DIAGNOSIS — Z5321 Procedure and treatment not carried out due to patient leaving prior to being seen by health care provider: Secondary | ICD-10-CM

## 2017-12-07 DIAGNOSIS — R69 Illness, unspecified: Secondary | ICD-10-CM

## 2017-12-07 DIAGNOSIS — Z79899 Other long term (current) drug therapy: Secondary | ICD-10-CM | POA: Insufficient documentation

## 2017-12-07 LAB — URINALYSIS, ROUTINE W REFLEX MICROSCOPIC
Bilirubin Urine: NEGATIVE
Glucose, UA: NEGATIVE mg/dL
Hgb urine dipstick: NEGATIVE
Ketones, ur: 80 mg/dL — AB
Leukocytes, UA: NEGATIVE
Nitrite: NEGATIVE
Protein, ur: 100 mg/dL — AB
Specific Gravity, Urine: 1.03 (ref 1.005–1.030)
pH: 5 (ref 5.0–8.0)

## 2017-12-07 LAB — POCT PREGNANCY, URINE: Preg Test, Ur: NEGATIVE

## 2017-12-07 NOTE — ED Triage Notes (Signed)
Pt c/o body aches, headache, vomiting/diarrhea, and fevers for 3 days. Has been taking nyquil without relief.

## 2017-12-07 NOTE — MAU Note (Addendum)
Pt reprots she has had n/v ,weakness, fever x 3 days. Temp 101 at home. Taking nightquil . Not sure if she is pregnant. LMP 10/24/17. Had a positive HPT last week.

## 2017-12-07 NOTE — MAU Note (Signed)
Pt called and not in lobby 

## 2017-12-07 NOTE — ED Notes (Signed)
Pt want to wait to have labs drawn when she is taken to treatment room.

## 2017-12-08 ENCOUNTER — Emergency Department (HOSPITAL_BASED_OUTPATIENT_CLINIC_OR_DEPARTMENT_OTHER): Payer: BLUE CROSS/BLUE SHIELD

## 2017-12-08 LAB — COMPREHENSIVE METABOLIC PANEL
ALT: 27 U/L (ref 14–54)
AST: 38 U/L (ref 15–41)
Albumin: 4.1 g/dL (ref 3.5–5.0)
Alkaline Phosphatase: 70 U/L (ref 38–126)
Anion gap: 10 (ref 5–15)
BUN: 16 mg/dL (ref 6–20)
CO2: 23 mmol/L (ref 22–32)
Calcium: 9.1 mg/dL (ref 8.9–10.3)
Chloride: 103 mmol/L (ref 101–111)
Creatinine, Ser: 0.76 mg/dL (ref 0.44–1.00)
GFR calc Af Amer: >60 mL/min (ref >60)
GFR calc non Af Amer: >60 mL/min (ref >60)
Glucose, Bld: 75 mg/dL (ref 65–99)
Potassium: 3.8 mmol/L (ref 3.5–5.1)
Sodium: 136 mmol/L (ref 135–145)
Total Bilirubin: 0.2 mg/dL — ABNORMAL LOW (ref 0.3–1.2)
Total Protein: 8.5 g/dL — ABNORMAL HIGH (ref 6.5–8.1)

## 2017-12-08 LAB — CBC
HCT: 44.1 % (ref 36.0–46.0)
Hemoglobin: 14.6 g/dL (ref 12.0–15.0)
MCH: 26.9 pg (ref 26.0–34.0)
MCHC: 33.1 g/dL (ref 30.0–36.0)
MCV: 81.2 fL (ref 78.0–100.0)
Platelets: 354 10*3/uL (ref 150–400)
RBC: 5.43 MIL/uL — ABNORMAL HIGH (ref 3.87–5.11)
RDW: 14.1 % (ref 11.5–15.5)
WBC: 5.3 10*3/uL (ref 4.0–10.5)

## 2017-12-08 LAB — LIPASE, BLOOD: Lipase: 35 U/L (ref 11–51)

## 2017-12-08 MED ORDER — SODIUM CHLORIDE 0.9 % IV BOLUS (SEPSIS)
1000.0000 mL | Freq: Once | INTRAVENOUS | Status: AC
Start: 2017-12-08 — End: 2017-12-08
  Administered 2017-12-08: 1000 mL via INTRAVENOUS

## 2017-12-08 MED ORDER — ONDANSETRON 4 MG PO TBDP: 4.0000 mg | ORAL_TABLET | Freq: Three times a day (TID) | ORAL | 0 refills | Status: DC | PRN

## 2017-12-08 MED ORDER — ONDANSETRON HCL 4 MG/2ML IJ SOLN
4.0000 mg | Freq: Once | INTRAMUSCULAR | Status: AC | PRN
  Administered 2017-12-08: 4 mg via INTRAVENOUS
  Filled 2017-12-08: qty 2

## 2017-12-08 MED ORDER — ONDANSETRON HCL 4 MG/2ML IJ SOLN
4.0000 mg | Freq: Once | INTRAMUSCULAR | Status: DC
  Filled 2017-12-08: qty 2

## 2017-12-08 MED ORDER — SODIUM CHLORIDE 0.9 % IV BOLUS (SEPSIS)
1000.0000 mL | Freq: Once | INTRAVENOUS | Status: AC
  Administered 2017-12-08: 1000 mL via INTRAVENOUS

## 2017-12-08 MED ORDER — KETOROLAC TROMETHAMINE 30 MG/ML IJ SOLN
15.0000 mg | Freq: Once | INTRAMUSCULAR | Status: AC
  Administered 2017-12-08: 15 mg via INTRAVENOUS
  Filled 2017-12-08: qty 1

## 2017-12-08 NOTE — ED Provider Notes (Signed)
[facility] EMERGENCY DEPARTMENT Provider Note   CSN: [id number] Arrival date & time: 12/07/17  1953     History   Chief Complaint Chief Complaint  Patient presents with  . Emesis    HPI Whitney Lang is a 27 y.o. female.  HPI  27 year old female presents with feeling ill starting 3 days ago.  She states she woke up with the symptoms.  She has been having vomiting, diarrhea, headache, cough with clear sputum, and diffuse body aches.  She states she has been feeling somewhat short of breath.  She has tried NyQuil.  She also tells me she tried Tylenol and ibuprofen yesterday with no relief.  She states that nowhere specifically hurts besides "my entire body".  She had a temperature of 100 earlier today.  She has been feeling hot and cold.  [facility] but was not seen there but did provide a urine sample.  She denies a sore throat.  She denies any significant past medical history. Feels diffusely weak.  Past Medical History:  Diagnosis Date  . Anxiety    not currently on meds  . Medical history non-contributory     There are no active problems to display for this patient.   Past Surgical History:  Procedure Laterality Date  . NO PAST SURGERIES      OB History    Gravida Para Term Preterm AB Living   0 0 0 0 0 0   SAB TAB Ectopic Multiple Live Births   0 0 0 0 0       Home Medications    Prior to Admission medications   Medication Sig Start Date End Date Taking? Authorizing Provider  hydrOXYzine (ATARAX/VISTARIL) 25 MG tablet Take 1 tablet (25 mg total) by mouth every 8 (eight) hours as needed for anxiety. 06/18/17   Shaune PollackIsaacs, Cameron, MD  ibuprofen (ADVIL,MOTRIN) 600 MG tablet Take 1 tablet (600 mg total) by mouth every 6 (six) hours as needed. Patient not taking: Reported on 06/18/2017 05/31/17   Hedges, Tinnie GensJeffrey, PA-C  metroNIDAZOLE (FLAGYL) 500 MG tablet Take 500 mg by mouth 2 (two) times daily.    [provider]  ondansetron (ZOFRAN  ODT) 4 MG disintegrating tablet Take 1 tablet (4 mg total) by mouth every 8 (eight) hours as needed for nausea or vomiting. 12/08/17   Pricilla LovelessGoldston, Zakeya Junker, MD  predniSONE (STERAPRED UNI-PAK 21 TAB) 10 MG (21) TBPK tablet Take 1 tablet (10 mg total) by mouth daily. Patient not taking: Reported on 03/28/2017 05/06/16   Ward, Layla MawKristen N, DO    Family History Family History  Problem Relation Age of Onset  . Hypertension Father     Social History Social History   Tobacco Use  . Smoking status: Never Smoker  . Smokeless tobacco: Never Used  Substance Use Topics  . Alcohol use: No  . Drug use: No     Allergies   Patient has no known allergies.   Review of Systems Review of Systems  Constitutional: Positive for fever.  Respiratory: Positive for cough and shortness of breath.   Gastrointestinal: Positive for abdominal pain, diarrhea, nausea and vomiting.  Genitourinary: Negative for dysuria.  Musculoskeletal: Positive for back pain and myalgias.  Neurological: Positive for weakness and headaches.  All other systems reviewed and are negative.    Physical Exam Updated Vital Signs BP 109/62   Pulse 79   Temp 98.3 F (36.8 C) (Oral)   Resp 18   Ht 5\' 1"  (1.549 m)  Wt 61.2 kg (135 lb)   LMP 11/24/2017   SpO2 99%   BMI 25.51 kg/m   Physical Exam  Constitutional: She is oriented to person, place, and time. She appears well-developed and well-nourished.  HENT:  Head: Normocephalic and atraumatic.  Right Ear: External ear normal.  Left Ear: External ear normal.  Nose: Nose normal.  Mouth/Throat: No oropharyngeal exudate.  Eyes: EOM are normal. Pupils are equal, round, and reactive to light. Right eye exhibits no discharge. Left eye exhibits no discharge.  Neck: Normal range of motion. Neck supple.  Cardiovascular: Normal rate, regular rhythm and normal heart sounds.  Pulmonary/Chest: Effort normal and breath sounds normal. She has no rales.  Abdominal: Soft. There is  tenderness (diffuse).  Abdomen is soft, but tender when I barely touch her skin diffusely  Musculoskeletal:  Diffuse low back tenderness  Neurological: She is alert and oriented to person, place, and time.  Skin: Skin is warm and dry. She is not diaphoretic.  Nursing note and vitals reviewed.    ED Treatments / Results  Labs (all labs ordered are listed, but only abnormal results are displayed) Labs Reviewed  COMPREHENSIVE METABOLIC PANEL - Abnormal; Notable for the following components:      Result Value   Total Protein 8.5 (*)    Total Bilirubin 0.2 (*)    All other components within normal limits  CBC - Abnormal; Notable for the following components:   RBC 5.43 (*)    All other components within normal limits  LIPASE, BLOOD    EKG  EKG Interpretation None       Radiology Dg Chest 2 View  Result Date: 12/08/2017 CLINICAL DATA:  Cough and fever EXAM: CHEST  2 VIEW COMPARISON:  05/31/2017 FINDINGS: The heart size and mediastinal contours are within normal limits. Both lungs are clear. The visualized skeletal structures are unremarkable. IMPRESSION: No active cardiopulmonary disease. Electronically Signed   By: Jasmine Pang M.D.   On: 12/08/2017 01:34    Procedures Procedures (including critical care time)  Medications Ordered in ED Medications  ondansetron (ZOFRAN) injection 4 mg (4 mg Intravenous Given 12/08/17 0013)  sodium chloride 0.9 % bolus 1,000 mL (0 mLs Intravenous Stopped 12/08/17 0151)  sodium chloride 0.9 % bolus 1,000 mL (0 mLs Intravenous Stopped 12/08/17 0114)  ketorolac (TORADOL) 30 MG/ML injection 15 mg (15 mg Intravenous Given 12/08/17 0043)     Initial Impression / Assessment and Plan / ED Course  I have reviewed the triage vital signs and the nursing notes.  Pertinent labs & imaging results that were available during my care of the patient were reviewed by me and considered in my medical decision making (see chart for details).     Patient  symptoms are consistent with influenza.  Her symptoms have been ongoing for over 48 hours.  Her vital signs are unremarkable including no increased work of breathing, tachycardia, hypotension, or fever.  Pulse ox normal.  Her lab work is also unremarkable with normal WBC and electrolytes.  Chest x-ray obtained given cough but no pneumonia is seen.  Her abdominal exam shows some soft but diffuse tenderness.  However my suspicion of an acute intra-abdominal emergency is low.  Urine pregnancy and urinalysis obtained at [facility] that were reviewed but unremarkable.  At this point I do not think she has a bacterial illness and think this is probably influenza.  However given she does not have any signs of severe illness or immunocompromise I do not think  Tamiflu is warranted.  She would not p.o. challenge in the ED but also has not vomited after multiple hours in the ED.  She appears stable for discharge home.  Return precautions.  Final Clinical Impressions(s) / ED Diagnoses   Final diagnoses:  Influenza-like illness    ED Discharge Orders        Ordered    ondansetron (ZOFRAN ODT) 4 MG disintegrating tablet  Every 8 hours PRN     12/08/17 0155       Pricilla Loveless, MD 12/08/17 [phone]

## 2017-12-08 NOTE — ED Notes (Signed)
Sat patient up in bed, she is attempting to drink gingerale, she is aware we need a urine sample

## 2018-02-15 ENCOUNTER — Emergency Department (HOSPITAL_COMMUNITY): Payer: BLUE CROSS/BLUE SHIELD

## 2018-02-15 ENCOUNTER — Emergency Department (HOSPITAL_COMMUNITY)
Admission: EM | Admit: 2018-02-15 | Discharge: 2018-02-15 | Disposition: A | Discharge status: Home / Self Care | Payer: BLUE CROSS/BLUE SHIELD | Attending: Emergency Medicine | Admitting: Emergency Medicine

## 2018-02-15 DIAGNOSIS — Y999 Unspecified external cause status: Secondary | ICD-10-CM | POA: Diagnosis not present

## 2018-02-15 DIAGNOSIS — S060X1A Concussion with loss of consciousness of 30 minutes or less, initial encounter: Secondary | ICD-10-CM

## 2018-02-15 DIAGNOSIS — Y92410 Unspecified street and highway as the place of occurrence of the external cause: Secondary | ICD-10-CM | POA: Diagnosis not present

## 2018-02-15 DIAGNOSIS — S0990XA Unspecified injury of head, initial encounter: Secondary | ICD-10-CM | POA: Diagnosis present

## 2018-02-15 DIAGNOSIS — S199XXA Unspecified injury of neck, initial encounter: Secondary | ICD-10-CM | POA: Diagnosis not present

## 2018-02-15 DIAGNOSIS — Y9389 Activity, other specified: Secondary | ICD-10-CM | POA: Insufficient documentation

## 2018-02-15 DIAGNOSIS — S79912A Unspecified injury of left hip, initial encounter: Secondary | ICD-10-CM | POA: Insufficient documentation

## 2018-02-15 DIAGNOSIS — S7002XA Contusion of left hip, initial encounter: Secondary | ICD-10-CM

## 2018-02-15 DIAGNOSIS — R109 Unspecified abdominal pain: Secondary | ICD-10-CM | POA: Diagnosis not present

## 2018-02-15 DIAGNOSIS — S8992XA Unspecified injury of left lower leg, initial encounter: Secondary | ICD-10-CM | POA: Diagnosis not present

## 2018-02-15 LAB — COMPREHENSIVE METABOLIC PANEL
ALT: 18 U/L (ref 14–54)
AST: 24 U/L (ref 15–41)
Albumin: 4.1 g/dL (ref 3.5–5.0)
Alkaline Phosphatase: 73 U/L (ref 38–126)
Anion gap: 13 (ref 5–15)
BUN: 15 mg/dL (ref 6–20)
CO2: 19 mmol/L — ABNORMAL LOW (ref 22–32)
Calcium: 9.2 mg/dL (ref 8.9–10.3)
Chloride: 105 mmol/L (ref 101–111)
Creatinine, Ser: 0.58 mg/dL (ref 0.44–1.00)
GFR calc Af Amer: >60 mL/min (ref >60)
GFR calc non Af Amer: >60 mL/min (ref >60)
Glucose, Bld: 77 mg/dL (ref 65–99)
Potassium: 3.7 mmol/L (ref 3.5–5.1)
Sodium: 137 mmol/L (ref 135–145)
Total Bilirubin: 0.4 mg/dL (ref 0.3–1.2)
Total Protein: 7.8 g/dL (ref 6.5–8.1)

## 2018-02-15 LAB — I-STAT BETA HCG BLOOD, ED (MC, WL, AP ONLY): I-stat hCG, quantitative: <5 m[IU]/mL (ref <5)

## 2018-02-15 LAB — SAMPLE TO BLOOD BANK

## 2018-02-15 LAB — I-STAT CHEM 8, ED
BUN: 16 mg/dL (ref 6–20)
Calcium, Ion: 1.15 mmol/L (ref 1.15–1.40)
Chloride: 106 mmol/L (ref 101–111)
Creatinine, Ser: 0.5 mg/dL (ref 0.44–1.00)
Glucose, Bld: 72 mg/dL (ref 65–99)
HCT: 40 % (ref 36.0–46.0)
Hemoglobin: 13.6 g/dL (ref 12.0–15.0)
Potassium: 3.7 mmol/L (ref 3.5–5.1)
Sodium: 139 mmol/L (ref 135–145)
TCO2: 22 mmol/L (ref 22–32)

## 2018-02-15 LAB — CDS SEROLOGY

## 2018-02-15 LAB — CBC
HCT: 38.1 % (ref 36.0–46.0)
Hemoglobin: 12.3 g/dL (ref 12.0–15.0)
MCH: 27.1 pg (ref 26.0–34.0)
MCHC: 32.3 g/dL (ref 30.0–36.0)
MCV: 83.9 fL (ref 78.0–100.0)
Platelets: 403 10*3/uL — ABNORMAL HIGH (ref 150–400)
RBC: 4.54 MIL/uL (ref 3.87–5.11)
RDW: 14.3 % (ref 11.5–15.5)
WBC: 6.5 10*3/uL (ref 4.0–10.5)

## 2018-02-15 LAB — I-STAT CG4 LACTIC ACID, ED: Lactic Acid, Venous: 2.23 mmol/L (ref 0.5–1.9)

## 2018-02-15 LAB — PROTIME-INR
INR: 1.01
Prothrombin Time: 13.2 seconds (ref 11.4–15.2)

## 2018-02-15 MED ORDER — FENTANYL CITRATE (PF) 100 MCG/2ML IJ SOLN
50.0000 ug | Freq: Once | INTRAMUSCULAR | Status: AC
  Administered 2018-02-15: 50 ug via INTRAVENOUS
  Filled 2018-02-15: qty 2

## 2018-02-15 MED ORDER — SODIUM CHLORIDE 0.9 % IV BOLUS
1000.0000 mL | Freq: Once | INTRAVENOUS | Status: AC
  Administered 2018-02-15: 1000 mL via INTRAVENOUS

## 2018-02-15 MED ORDER — IOPAMIDOL (ISOVUE-300) INJECTION 61%
100.0000 mL | Freq: Once | INTRAVENOUS | Status: AC | PRN
  Administered 2018-02-15: 100 mL via INTRAVENOUS

## 2018-02-15 MED ORDER — CYCLOBENZAPRINE HCL 10 MG PO TABS: 10.0000 mg | ORAL_TABLET | Freq: Two times a day (BID) | ORAL | 0 refills | Status: AC | PRN

## 2018-02-15 MED ORDER — IOPAMIDOL (ISOVUE-300) INJECTION 61%
INTRAVENOUS | Status: AC
  Filled 2018-02-15: qty 100

## 2018-02-15 MED ORDER — ONDANSETRON HCL 4 MG/2ML IJ SOLN
4.0000 mg | Freq: Once | INTRAMUSCULAR | Status: AC
  Administered 2018-02-15: 4 mg via INTRAVENOUS
  Filled 2018-02-15: qty 2

## 2018-02-15 MED ORDER — FENTANYL CITRATE (PF) 100 MCG/2ML IJ SOLN
100.0000 ug | Freq: Once | INTRAMUSCULAR | Status: DC
  Filled 2018-02-15: qty 2

## 2018-02-15 MED ORDER — IBUPROFEN 600 MG PO TABS: 600.0000 mg | ORAL_TABLET | Freq: Four times a day (QID) | ORAL | 0 refills | Status: AC | PRN
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
## 2018-02-15 NOTE — ED Notes (Signed)
Pt to CT at this time.
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
## 2018-02-15 NOTE — ED Notes (Signed)
Pt's father at bedside

## 2018-03-23 ENCOUNTER — Encounter (HOSPITAL_COMMUNITY): Payer: Self-pay

## 2018-03-23 ENCOUNTER — Emergency Department (HOSPITAL_COMMUNITY)
Admission: EM | Admit: 2018-03-23 | Discharge: 2018-03-23 | Disposition: A | Discharge status: Home / Self Care | Payer: BLUE CROSS/BLUE SHIELD | Attending: Emergency Medicine | Admitting: Emergency Medicine

## 2018-03-23 ENCOUNTER — Other Ambulatory Visit: Payer: Self-pay

## 2018-03-23 DIAGNOSIS — R55 Syncope and collapse: Secondary | ICD-10-CM | POA: Diagnosis not present

## 2018-03-23 DIAGNOSIS — Z5321 Procedure and treatment not carried out due to patient leaving prior to being seen by health care provider: Secondary | ICD-10-CM | POA: Diagnosis not present

## 2018-03-23 NOTE — ED Notes (Signed)
Patient requesting to leave. IV taken out and patient seen leaving the department. Patient requested work note but this Clinical research associate stated since she was not seen by a doctor she cannot get a doctor's note.

## 2018-03-23 NOTE — ED Notes (Signed)
Patient refused blood draw.

## 2018-03-23 NOTE — ED Notes (Signed)
Pt unwilling to let this tech get an EKG. Pt sts'" she is tired and wants to rest. " Pt advised she can go to lobby and wait on a room in the back, pt made aware not to leave without letting staff take her IV out

## 2018-03-23 NOTE — ED Triage Notes (Addendum)
Pt "passed out" at work today. Pt has hx of this with menstrual cycle. Pt states she is on medication to "not pass out" . Pt states she has run out of meds. Denies head/neck/back pain. Given 4 mg zofran en route and fluids. Pt states that she has a headache currently. Pt c/o stomach pain. Vomited 1x en route with EMS

## 2018-03-23 NOTE — ED Notes (Signed)
Bed: [facility] Expected date:  Expected time:  Means of arrival:  Comments: 

## 2019-01-14 ENCOUNTER — Emergency Department (HOSPITAL_COMMUNITY)
Admission: EM | Admit: 2019-01-14 | Discharge: 2019-01-14 | Disposition: A | Discharge status: Home / Self Care | Payer: Self-pay | Attending: Emergency Medicine | Admitting: Emergency Medicine

## 2019-01-14 ENCOUNTER — Emergency Department (HOSPITAL_COMMUNITY): Payer: Self-pay

## 2019-01-14 ENCOUNTER — Encounter (HOSPITAL_COMMUNITY): Payer: Self-pay | Admitting: *Deleted

## 2019-01-14 ENCOUNTER — Other Ambulatory Visit: Payer: Self-pay

## 2019-01-14 DIAGNOSIS — Z79899 Other long term (current) drug therapy: Secondary | ICD-10-CM | POA: Insufficient documentation

## 2019-01-14 DIAGNOSIS — M79641 Pain in right hand: Secondary | ICD-10-CM | POA: Insufficient documentation

## 2019-01-14 NOTE — ED Provider Notes (Signed)
[facility] EMERGENCY DEPARTMENT Provider Note   CSN: [id number] Arrival date & time: 01/14/19  1107    History   Chief Complaint Chief Complaint  Patient presents with  . Finger Injury    HPI Rayna Nicolls is a 28 y.o. female.     HPI   28 year old female presents today with complaints of right-sided hand pain.  Patient notes that he was at work 3 days ago when she was trying to push a box through a roller.  She notes she was punching the box with the palmar aspect of her hand.  She reports at the end of her shift she had pain along the right finger dorsal hand thumb.  She notes subjective swelling.  No medications prior to arrival today.  She is right-hand dominant.  Past Medical History:  Diagnosis Date  . Anxiety    not currently on meds  . Medical history non-contributory     There are no active problems to display for this patient.   Past Surgical History:  Procedure Laterality Date  . NO PAST SURGERIES       OB History    Gravida  0   Para  0   Term  0   Preterm  0   AB  0   Living  0     SAB  0   TAB  0   Ectopic  0   Multiple  0   Live Births  0            Home Medications    Prior to Admission medications   Medication Sig Start Date End Date Taking? Authorizing Provider  hydrOXYzine (ATARAX/VISTARIL) 25 MG tablet Take 1 tablet (25 mg total) by mouth every 8 (eight) hours as needed for anxiety. 06/18/17   Shaune Pollack, MD  ibuprofen (ADVIL,MOTRIN) 600 MG tablet Take 1 tablet (600 mg total) by mouth every 6 (six) hours as needed. 05/31/17   Poet Hineman, Tinnie Gens, PA-C  metroNIDAZOLE (FLAGYL) 500 MG tablet Take 500 mg by mouth 2 (two) times daily.    [provider]  ondansetron (ZOFRAN ODT) 4 MG disintegrating tablet Take 1 tablet (4 mg total) by mouth every 8 (eight) hours as needed for nausea or vomiting. 12/08/17   Pricilla Loveless, MD  predniSONE (STERAPRED UNI-PAK 21 TAB) 10 MG (21) TBPK tablet Take 1 tablet  (10 mg total) by mouth daily. 05/06/16   Ward, Layla Maw, DO    Family History Family History  Problem Relation Age of Onset  . Hypertension Father     Social History Social History   Tobacco Use  . Smoking status: Never Smoker  . Smokeless tobacco: Never Used  Substance Use Topics  . Alcohol use: No  . Drug use: No     Allergies   Patient has no known allergies.   Review of Systems Review of Systems  All other systems reviewed and are negative.    Physical Exam Updated Vital Signs BP 101/83 (BP Location: Left Arm)   Pulse 77   Temp 98.6 F (37 C) (Oral)   Resp 18   Ht 5\' 1"  (1.549 m)   Wt 63.5 kg   LMP 12/28/2018   SpO2 98%   BMI 26.45 kg/m   Physical Exam Vitals signs and nursing note reviewed.  Constitutional:      Appearance: She is well-developed.  HENT:     Head: Normocephalic and atraumatic.  Eyes:     General: No scleral icterus.  Right eye: No discharge.        Left eye: No discharge.     Conjunctiva/sclera: Conjunctivae normal.     Pupils: Pupils are equal, round, and reactive to light.  Neck:     Musculoskeletal: Normal range of motion.     Vascular: No JVD.     Trachea: No tracheal deviation.  Pulmonary:     Effort: Pulmonary effort is normal.     Breath sounds: No stridor.  Musculoskeletal:     Comments: Right hand atraumatic without swelling or edema, tenderness palpation throughout the dorsal metacarpal snuffbox wrist and proximal thumb  Neurological:     Mental Status: She is alert and oriented to person, place, and time.     Coordination: Coordination normal.  Psychiatric:        Behavior: Behavior normal.        Thought Content: Thought content normal.        Judgment: Judgment normal.     ED Treatments / Results  Labs (all labs ordered are listed, but only abnormal results are displayed) Labs Reviewed - No data to display  EKG None  Radiology Dg Hand Complete Right  Result Date: 01/14/2019 CLINICAL DATA:   Repetitive injury to the RIGHT hand with acute onset of pain that began 3 days ago. Initial encounter. EXAM: RIGHT HAND - COMPLETE 3+ VIEW COMPARISON:  12/15/2014. FINDINGS: Patient was unable to remove the ring from the ring finger due to swelling. No evidence of acute fracture or dislocation. Joint spaces well preserved. Well-preserved bone mineral density. No intrinsic osseous abnormalities. IMPRESSION: No osseous abnormality. Electronically Signed   By: Hulan Saas M.D.   On: 01/14/2019 11:52    Procedures Procedures (including critical care time)  Medications Ordered in ED Medications - No data to display   Initial Impression / Assessment and Plan / ED Course  I have reviewed the triage vital signs and the nursing notes.  Pertinent labs & imaging results that were available during my care of the patient were reviewed by me and considered in my medical decision making (see chart for details).        Labs:   Imaging: DG hand complete right  Consults:  Therapeutics:  Discharge Meds:   Assessment/Plan: 28 year old female presents today with complaints of hand pain.  She has nonfocal findings but does have pain over the snuffbox.  She replaced in a thumb spica, she is instructed to have repeat x-ray performed in 2 weeks.  Patient given care instructions.  She verbalized understanding and agreement to today's plan.    Final Clinical Impressions(s) / ED Diagnoses   Final diagnoses:  Pain of right hand    ED Discharge Orders    None       Rosalio Loud 01/14/19 1222    Arby Barrette, MD 01/15/19 1253

## 2019-01-14 NOTE — ED Notes (Signed)
Declined W/C at D/C and was escorted to lobby by RN. 

## 2019-01-14 NOTE — Discharge Instructions (Addendum)
Please read the attached information.  You have tenderness over the hand at the scaphoid region.  I would recommend that in 1-2 weeks to follow-up with your primary care provider or hand specialist for repeat evaluation and x-ray.

## 2019-01-14 NOTE — ED Triage Notes (Signed)
PT reports hitting her RT thumb and index finger on amachine at work on Saturday. PT reported swelling to same on SAT.night. pain score of 6/10.

## 2019-01-14 NOTE — Progress Notes (Signed)
Orthopedic Tech Progress Note Patient Details:  Whitney Lang 05-Jun-1991 [id number]  Ortho Devices Type of Ortho Device: Thumb spica splint Ortho Device/Splint Interventions: Ordered, Adjustment, Application   Post Interventions Patient Tolerated: Well Instructions Provided: Adjustment of device, Care of device   Whitney Lang [name] 01/14/2019, 1:10 PM

## 2019-11-18 ENCOUNTER — Encounter: Payer: Self-pay | Admitting: Emergency Medicine

## 2019-11-18 ENCOUNTER — Other Ambulatory Visit: Payer: Self-pay

## 2019-11-18 ENCOUNTER — Emergency Department
Admission: EM | Admit: 2019-11-18 | Discharge: 2019-11-18 | Disposition: A | Discharge status: Home / Self Care | Payer: 59 | Attending: Emergency Medicine | Admitting: Emergency Medicine

## 2019-11-18 DIAGNOSIS — Z20822 Contact with and (suspected) exposure to covid-19: Secondary | ICD-10-CM | POA: Insufficient documentation

## 2019-11-18 DIAGNOSIS — B349 Viral infection, unspecified: Secondary | ICD-10-CM

## 2019-11-18 DIAGNOSIS — R519 Headache, unspecified: Secondary | ICD-10-CM | POA: Diagnosis present

## 2019-11-18 LAB — SARS CORONAVIRUS 2 (TAT 6-24 HRS): SARS Coronavirus 2: NEGATIVE

## 2019-11-18 MED ORDER — ACETAMINOPHEN 500 MG PO TABS
1000.0000 mg | ORAL_TABLET | Freq: Once | ORAL | Status: AC
  Administered 2019-11-18: 1000 mg via ORAL
  Filled 2019-11-18: qty 2

## 2019-11-18 NOTE — ED Triage Notes (Signed)
Pt reports woke up this am with chills, sneezing, bodyaches, headache and feeling nauseated. Denies fevers.

## 2019-11-18 NOTE — ED Provider Notes (Signed)
[facility] Emergency Department Provider Note  ____________________________________________   First MD Initiated Contact with Patient 11/18/19 [phone]     (approximate)  I have reviewed the triage vital signs and the nursing notes.   HISTORY  Chief Complaint Nausea, Headache, Nasal Congestion, sneezing, and Generalized Body Aches   HPI Whitney Lang is a 29 y.o. female presents to the ED with complaint of chills, sneezing, body aches and headache.  Patient states she also felt nauseous when she woke up this morning.  She states that she was supposed to be at work at 630 and her symptoms started at 5 AM.  She is unaware of any exposure to Covid.  She denies any change in smell or taste, no vomiting or diarrhea.  She rates her pain as a 6 out of 10.     Past Medical History:  Diagnosis Date  . Anxiety    not currently on meds  . Medical history non-contributory     There are no problems to display for this patient.   Past Surgical History:  Procedure Laterality Date  . NO PAST SURGERIES      Prior to Admission medications   Medication Sig Start Date End Date Taking? Authorizing Provider  hydrOXYzine (ATARAX/VISTARIL) 25 MG tablet Take 1 tablet (25 mg total) by mouth every 8 (eight) hours as needed for anxiety. 06/18/17   Shaune Pollack, MD    Allergies Patient has no known allergies.  Family History  Problem Relation Age of Onset  . Hypertension Father     Social History Social History   Tobacco Use  . Smoking status: Never Smoker  . Smokeless tobacco: Never Used  Substance Use Topics  . Alcohol use: No  . Drug use: No    Review of Systems Constitutional: Subjective fever/positive chills Eyes: No visual changes. ENT: No sore throat.  Positive for sneezing. Cardiovascular: Denies chest pain. Respiratory: Denies shortness of breath. Gastrointestinal: No abdominal pain.  Positive nausea, no vomiting.  No diarrhea.  No  constipation. Genitourinary: Negative for dysuria. Musculoskeletal: Positive for body aches. Skin: Negative for rash. Neurological: Positive for headaches, negative for focal weakness or numbness. ____________________________________________   PHYSICAL EXAM:  VITAL SIGNS: ED Triage Vitals  Enc Vitals Group     BP 11/18/19 0724 (!) 122/57     Pulse Rate 11/18/19 0724 63     Resp 11/18/19 0724 18     Temp 11/18/19 0724 97.9 F (36.6 C)     Temp Source 11/18/19 0724 Oral     SpO2 11/18/19 0724 100 %     Weight 11/18/19 0725 150 lb (68 kg)     Height 11/18/19 0725 5\' 1"  (1.549 m)     Head Circumference --      Peak Flow --      Pain Score 11/18/19 0727 6     Pain Loc --      Pain Edu? --      Excl. in GC? --    Constitutional: Alert and oriented. Well appearing and in no acute distress. Eyes: Conjunctivae are normal.  Head: Atraumatic. Nose: No congestion/rhinnorhea. Neck: No stridor.   Cardiovascular: Normal rate, regular rhythm. Grossly normal heart sounds.  Good peripheral circulation. Respiratory: Normal respiratory effort.  No retractions. Lungs CTAB. Gastrointestinal: Soft and nontender. No distention. No CVA tenderness. Musculoskeletal: Moves upper and lower extremities no difficulty.  Normal gait was noted. Neurologic:  Normal speech and language. No gross focal neurologic deficits are appreciated. No  gait instability. Skin:  Skin is warm, dry and intact. No rash noted. Psychiatric: Mood and affect are normal. Speech and behavior are normal.  ____________________________________________   LABS (all labs ordered are listed, but only abnormal results are displayed)  Labs Reviewed  SARS CORONAVIRUS 2 (TAT 6-24 HRS)     PROCEDURES  Procedure(s) performed (including Critical Care):  Procedures  ___________________________________________   INITIAL IMPRESSION / ASSESSMENT AND PLAN / ED COURSE  As part of my medical decision making, I reviewed the following  data within the electronic MEDICAL RECORD NUMBER Notes from prior ED visits and [place] Controlled Substance Database Whitney Lang was evaluated in Emergency Department on 11/18/2019 for the symptoms described in the history of present illness. She was evaluated in the context of the global COVID-19 pandemic, which necessitated consideration that the patient might be at risk for infection with the SARS-CoV-2 virus that causes COVID-19. Institutional protocols and algorithms that pertain to the evaluation of patients at risk for COVID-19 are in a state of rapid change based on information released by regulatory bodies including the CDC and federal and state organizations. These policies and algorithms were followed during the patient's care in the ED.  29 year old female presents to the ED with complaint of body aches, chills, headache and nausea.  Patient states that she woke this morning with symptoms at 5 AM.  She states that she was supposed to be at work at 6:30 AM.  She is unaware of any exposure to Covid.  A Covid test was done and patient is aware that she needs to quarantine until she has her the results of this test.  She was given a note for work explaining that she is out of work for 2 days and if her test is positive she will need an additional 10 days out of work.  ____________________________________________   FINAL CLINICAL IMPRESSION(S) / ED DIAGNOSES  Final diagnoses:  Viral illness     ED Discharge Orders    None       Note:  This document was prepared using Dragon voice recognition software and may include unintentional dictation errors.    Johnn Hai, PA-C 11/18/19 1046    Lavonia Drafts, MD 11/18/19 1234

## 2019-11-18 NOTE — Discharge Instructions (Signed)
Follow-up with your primary care provider or can no clinic acute care if any continued problems.  Your Covid test will result tomorrow.  Until that time you are to quarantine.  If your test is positive you will need an additional 10 days in quarantine.  Take Tylenol or ibuprofen as needed for headache, body aches, fever.  Increase fluids to stay hydrated.  Read information about quarantine so that you can protect others from getting infected should your test be positive.

## 2019-11-18 NOTE — ED Notes (Signed)
See triage note.  Presents with body aches,h/a and chills this am  Afebrile on arrival

## 2019-11-20 ENCOUNTER — Telehealth: Payer: Self-pay | Admitting: General Practice

## 2019-11-20 NOTE — Telephone Encounter (Signed)
Negative COVID results given. Patient results "NOT Detected." Caller expressed understanding. ° °
